# Patient Record
Sex: Male | Born: 1955 | Race: White | Hispanic: No | Marital: Married | Smoking: Never smoker
Health system: Southern US, Community
[De-identification: ages and names within clinical notes are randomized; demographics above are authoritative.]

## PROBLEM LIST (undated history)

## (undated) DIAGNOSIS — I2699 Other pulmonary embolism without acute cor pulmonale: Secondary | ICD-10-CM

## (undated) DIAGNOSIS — A770 Spotted fever due to Rickettsia rickettsii: Secondary | ICD-10-CM

## (undated) DIAGNOSIS — R569 Unspecified convulsions: Secondary | ICD-10-CM

## (undated) HISTORY — PX: OTHER SURGICAL HISTORY: SHX169

---

## 2006-10-20 ENCOUNTER — Observation Stay: Payer: Self-pay | Admitting: Internal Medicine

## 2006-10-20 ENCOUNTER — Other Ambulatory Visit: Payer: Self-pay

## 2006-10-21 ENCOUNTER — Other Ambulatory Visit: Payer: Self-pay

## 2006-10-23 ENCOUNTER — Ambulatory Visit: Payer: Self-pay | Admitting: Internal Medicine

## 2006-10-30 ENCOUNTER — Ambulatory Visit: Payer: Self-pay | Admitting: *Deleted

## 2012-06-26 ENCOUNTER — Ambulatory Visit: Payer: Self-pay | Admitting: Internal Medicine

## 2012-12-09 ENCOUNTER — Encounter (HOSPITAL_COMMUNITY): Payer: Self-pay | Admitting: *Deleted

## 2012-12-09 ENCOUNTER — Emergency Department (HOSPITAL_COMMUNITY): Payer: 59

## 2012-12-09 ENCOUNTER — Observation Stay (HOSPITAL_COMMUNITY)
Admission: EM | Admit: 2012-12-09 | Discharge: 2012-12-10 | Disposition: A | Discharge status: Home / Self Care | Payer: 59 | Attending: Internal Medicine | Admitting: Internal Medicine

## 2012-12-09 DIAGNOSIS — Z86718 Personal history of other venous thrombosis and embolism: Secondary | ICD-10-CM | POA: Insufficient documentation

## 2012-12-09 DIAGNOSIS — M79609 Pain in unspecified limb: Secondary | ICD-10-CM

## 2012-12-09 DIAGNOSIS — R0602 Shortness of breath: Secondary | ICD-10-CM | POA: Insufficient documentation

## 2012-12-09 DIAGNOSIS — G40909 Epilepsy, unspecified, not intractable, without status epilepticus: Secondary | ICD-10-CM | POA: Diagnosis present

## 2012-12-09 DIAGNOSIS — I2699 Other pulmonary embolism without acute cor pulmonale: Principal | ICD-10-CM | POA: Insufficient documentation

## 2012-12-09 DIAGNOSIS — Z86711 Personal history of pulmonary embolism: Secondary | ICD-10-CM | POA: Diagnosis present

## 2012-12-09 DIAGNOSIS — R079 Chest pain, unspecified: Secondary | ICD-10-CM | POA: Insufficient documentation

## 2012-12-09 DIAGNOSIS — R1011 Right upper quadrant pain: Secondary | ICD-10-CM | POA: Insufficient documentation

## 2012-12-09 HISTORY — DX: Unspecified convulsions: R56.9

## 2012-12-09 LAB — BASIC METABOLIC PANEL
BUN: 16 mg/dL (ref 6–23)
CO2: 21 mEq/L (ref 19–32)
Calcium: 9.9 mg/dL (ref 8.4–10.5)
Chloride: 97 mEq/L (ref 96–112)
Creatinine, Ser: 1.06 mg/dL (ref 0.50–1.35)
GFR calc Af Amer: >90 mL/min (ref >90)
GFR calc non Af Amer: 78 mL/min — ABNORMAL LOW (ref >90)
Glucose, Bld: 104 mg/dL — ABNORMAL HIGH (ref 70–99)
Potassium: 4.1 mEq/L (ref 3.5–5.1)
Sodium: 134 mEq/L — ABNORMAL LOW (ref 135–145)

## 2012-12-09 LAB — CBC
HCT: 37.7 % — ABNORMAL LOW (ref 39.0–52.0)
Hemoglobin: 13.8 g/dL (ref 13.0–17.0)
MCH: 33.3 pg (ref 26.0–34.0)
MCHC: 36.6 g/dL — ABNORMAL HIGH (ref 30.0–36.0)
MCV: 91.1 fL (ref 78.0–100.0)
Platelets: 216 10*3/uL (ref 150–400)
RBC: 4.14 MIL/uL — ABNORMAL LOW (ref 4.22–5.81)
RDW: 12.1 % (ref 11.5–15.5)
WBC: 10.7 10*3/uL — ABNORMAL HIGH (ref 4.0–10.5)

## 2012-12-09 LAB — HEPATIC FUNCTION PANEL
ALT: 15 U/L (ref 0–53)
AST: 21 U/L (ref 0–37)
Albumin: 3.6 g/dL (ref 3.5–5.2)
Alkaline Phosphatase: 56 U/L (ref 39–117)
Bilirubin, Direct: <0.1 mg/dL (ref 0.0–0.3)
Total Bilirubin: 0.4 mg/dL (ref 0.3–1.2)
Total Protein: 8.2 g/dL (ref 6.0–8.3)

## 2012-12-09 LAB — TROPONIN I: Troponin I: <0.3 ng/mL (ref <0.30)

## 2012-12-09 LAB — PROTIME-INR
INR: 1.15 (ref 0.00–1.49)
Prothrombin Time: 14.5 seconds (ref 11.6–15.2)

## 2012-12-09 LAB — D-DIMER, QUANTITATIVE: D-Dimer, Quant: 3.36 ug/mL-FEU — ABNORMAL HIGH (ref 0.00–0.48)

## 2012-12-09 LAB — POCT I-STAT TROPONIN I: Troponin i, poc: 0.08 ng/mL (ref 0.00–0.08)

## 2012-12-09 LAB — LIPASE, BLOOD: Lipase: 21 U/L (ref 11–59)

## 2012-12-09 LAB — APTT: aPTT: 33 seconds (ref 24–37)

## 2012-12-09 MED ORDER — IOHEXOL 350 MG/ML SOLN
100.0000 mL | Freq: Once | INTRAVENOUS | Status: AC | PRN
  Administered 2012-12-09: 100 mL via INTRAVENOUS

## 2012-12-09 MED ORDER — HYDROMORPHONE HCL PF 1 MG/ML IJ SOLN
1.0000 mg | Freq: Once | INTRAMUSCULAR | Status: AC
  Administered 2012-12-09: 1 mg via INTRAVENOUS
  Filled 2012-12-09: qty 1

## 2012-12-09 MED ORDER — RIVAROXABAN 15 MG PO TABS
15.0000 mg | ORAL_TABLET | Freq: Two times a day (BID) | ORAL | Status: DC
  Administered 2012-12-10: 15 mg via ORAL
  Filled 2012-12-09 (×3): qty 1

## 2012-12-09 MED ORDER — RIVAROXABAN 20 MG PO TABS: 20.0000 mg | ORAL_TABLET | Freq: Every day | ORAL | Status: DC

## 2012-12-09 MED ORDER — SODIUM CHLORIDE 0.9 % IV SOLN
INTRAVENOUS | Status: AC
  Administered 2012-12-09 – 2012-12-10 (×2): via INTRAVENOUS

## 2012-12-09 MED ORDER — HYDROCODONE-ACETAMINOPHEN 5-325 MG PO TABS: 1.0000 | ORAL_TABLET | ORAL | Status: DC | PRN

## 2012-12-09 MED ORDER — PHENYTOIN SODIUM EXTENDED 100 MG PO CAPS: 100.0000 mg | ORAL_CAPSULE | Freq: Three times a day (TID) | ORAL | Status: DC

## 2012-12-09 MED ORDER — ENOXAPARIN SODIUM 150 MG/ML ~~LOC~~ SOLN
1.0000 mg/kg | Freq: Once | SUBCUTANEOUS | Status: DC
Start: 2012-12-09 — End: 2012-12-09

## 2012-12-09 MED ORDER — LEVETIRACETAM ER 500 MG PO TB24
500.0000 mg | ORAL_TABLET | Freq: Every day | ORAL | Status: DC
  Filled 2012-12-09: qty 1

## 2012-12-09 MED ORDER — SODIUM CHLORIDE 0.9 % IJ SOLN
3.0000 mL | Freq: Two times a day (BID) | INTRAMUSCULAR | Status: DC
  Administered 2012-12-09: 3 mL via INTRAVENOUS

## 2012-12-09 MED ORDER — ENOXAPARIN SODIUM 100 MG/ML ~~LOC~~ SOLN
90.0000 mg | Freq: Two times a day (BID) | SUBCUTANEOUS | Status: DC
  Administered 2012-12-09: 90 mg via SUBCUTANEOUS
  Filled 2012-12-09 (×2): qty 1

## 2012-12-09 NOTE — ED Notes (Signed)
Pt reports he pulled a tick off ABD 2 weeks ago and he also found a tick on his Lt posterior leg.

## 2012-12-09 NOTE — ED Notes (Signed)
Pt reports pain below sternum  Occurs at night and when he takes a deep breath . Pt repots a Hx of DVT . Pt had a lump in his rt LE several days ago that went away.

## 2012-12-09 NOTE — ED Notes (Signed)
Pt reports he lost 8 lbs since Friday.

## 2012-12-09 NOTE — Progress Notes (Signed)
ANTICOAGULATION CONSULT NOTE - Initial Consult  Pharmacy Consult for Xarelto Indication: pulmonary embolus  Allergies  Allergen Reactions  . Penicillins     unknown    Patient Measurements: Height: 6\' 2"  (188 cm) Weight: 192 lb (87.091 kg) IBW/kg (Calculated) : 82.2  Vital Signs: Temp: 98.2 F (36.8 C) (05/19 2123) Temp src: Oral (05/19 2123) BP: 141/78 mmHg (05/19 2123) Pulse Rate: 74 (05/19 2123)  Labs:  Recent Labs  12/09/12 1440 12/09/12 1900  HGB 13.8  --   HCT 37.7*  --   PLT 216  --   APTT  --  33  LABPROT  --  14.5  INR  --  1.15  CREATININE 1.06  --     Estimated Creatinine Clearance: 92.6 ml/min (by C-G formula based on Cr of 1.06).   Medical History: Past Medical History  Diagnosis Date  . Seizures     Medications:  Scheduled:  . enoxaparin (LOVENOX) injection  90 mg Subcutaneous BID  . phenytoin  100 mg Oral TID  . sodium chloride  3 mL Intravenous Q12H    Assessment: 57 year old male admitted with pulmonary embolus.  He received Lovenox in the ED, and is now to transition to Xarelto.  His Xarelto should start 10-12 hours after his last dose of Lovenox.  However, phenytoin can significantly reduce exposure to Xarelto and this combination should be avoided due to risk of subtherapeutic anticoagulation.  Plan:  Discussed with Dr. Eben Burow - he will change Mr. Crunkleton seizure regimen from phenytoin to Keppra. Start Xarelto 15mg  BID x 21 days on 5/20 AM Followed by Xarelto 20mg  daily  Estella Husk, Pharm.D., BCPS Clinical Pharmacist Phone: [phone] or [phone] Pager: [phone] 12/09/2012, 10:05 PM

## 2012-12-09 NOTE — Progress Notes (Signed)
Left lower extremity venous duplex completed.  Left:  No evidence of DVT, superficial thrombosis, or Baker's cyst.  Right:  Negative for DVT in the common femoral vein.  

## 2012-12-09 NOTE — ED Provider Notes (Signed)
History     CSN: [id number]  Arrival date & time 12/09/12  1311   First MD Initiated Contact with Patient 12/09/12 1348      Chief Complaint  Patient presents with  . Rib pain   . Abdominal Pain    (Consider location/radiation/quality/duration/timing/severity/associated sxs/prior treatment) HPI Comments: Scott Fleming is a 57 y/o M with PMHx of seizures (last seizure was 26 years ago - as per patient) presenting to the ED with right rib pain and abdominal pain that has been ongoing since Friday. Patient reported that when at rest the rib pain is not that bad, but when he lays down the right rib pain is worse - described as a stabbing sensation that is intermittent and radiates across the entire abdomen and back - in a circumferential pattern. Patient reported that this pain lasts a couple of minutes, but intensifies when he lays down. Patient reported that when he lays down he has referred pain to his right shoulder. Patient stated that when he takes a deep breathe in there is stabbing pain to the right lower rib, RUQ, and across the upper abdomen and mid-back. Patient stated that the pain has gotten progressively worse over the past couple of days, leading to difficulty sleeping at night. Patient stated that he has been having episodes of shortness of breathe with activity. Patient stated that he is an active runner - runs approximately 4 times per week - stated that he tried running the other day and became short of breathe within 2 minutes. Stated that he has been feeling rather tired and fatigued most of the day. Patient reported history of DVT and varicose veins - stated that he travels frequently for his job; plane travels 4-5 times per year and continuous driving in [place] all over everyday. Patient reported that he has been having mild left calf pain that has been ongoing for the past couple of days. Reported nausea that started yesterday - stated that he had one episode of emesis  yesterday after dinner - denied nausea or vomiting today. Denied abdominal pain, headaches, fever, chills, neurological deficits.  Patient reported being bit by a tic approximately 1 week ago - stated that it was found on his abdomen shortly after working in the yard, possibly present for at least one or two days. Denied weakness, pain.   The history is provided by the patient. No language interpreter was used.    Past Medical History  Diagnosis Date  . Seizures     Past Surgical History  Procedure Laterality Date  . Skin cancer removed      No family history on file.  History  Substance Use Topics  . Smoking status: Never Smoker   . Smokeless tobacco: Not on file  . Alcohol Use: Yes     Comment: occ      Review of Systems  Constitutional: Positive for fatigue. Negative for fever and chills.  HENT: Negative for ear pain, congestion, sore throat, rhinorrhea, trouble swallowing, neck pain, neck stiffness and tinnitus.   Eyes: Negative for photophobia, pain and visual disturbance.  Respiratory: Negative for cough, chest tightness and shortness of breath.   Cardiovascular: Negative for chest pain.  Gastrointestinal: Positive for nausea, vomiting and abdominal pain. Negative for diarrhea and blood in stool.  Genitourinary: Negative for dysuria, urgency, hematuria, flank pain, decreased urine volume, discharge, penile swelling, difficulty urinating, penile pain and testicular pain.  Musculoskeletal: Negative for back pain and arthralgias.  Skin: Negative for rash and  wound.  Neurological: Negative for dizziness, seizures, speech difficulty, weakness, light-headedness, numbness and headaches.  All other systems reviewed and are negative.    Allergies  Penicillins  Home Medications   Current Outpatient Rx  Name  Route  Sig  Dispense  Refill  . phenytoin (DILANTIN) 100 MG ER capsule   Oral   Take 100 mg by mouth 3 (three) times daily.           BP 134/85  Pulse 75   Temp(Src) 98.2 F (36.8 C) (Oral)  Resp 18  SpO2 95%  Physical Exam  Nursing note and vitals reviewed. Constitutional: He is oriented to person, place, and time. He appears well-developed and well-nourished. No distress.  HENT:  Head: Normocephalic and atraumatic.  Mouth/Throat: Oropharynx is clear and moist. No oropharyngeal exudate.  Uvula midline, symmetrical elevation  Eyes: Conjunctivae and EOM are normal. Pupils are equal, round, and reactive to light. Right eye exhibits no discharge. Left eye exhibits no discharge.  Neck: Normal range of motion. Neck supple. No tracheal deviation present. No thyromegaly present.  Negative neck stiffness Negative nuchal rigidity Negative lymphadenopathy  Cardiovascular: Normal rate, regular rhythm and normal heart sounds.  Exam reveals no friction rub.   No murmur heard. Pulses:      Radial pulses are 2+ on the right side, and 2+ on the left side.       Dorsalis pedis pulses are 2+ on the right side, and 2+ on the left side.  Positive Homan's sign to the left calf  Pulmonary/Chest: Effort normal and breath sounds normal. No respiratory distress. He has no wheezes. He has no rales. He exhibits no tenderness.  Abdominal: Soft. Bowel sounds are normal. He exhibits no distension and no mass. There is no hepatosplenomegaly. There is tenderness in the right upper quadrant. There is positive Murphy's sign. There is no rigidity, no rebound, no guarding and no tenderness at McBurney's point. No hernia.    Negative Obtruator Negative Psoas  Musculoskeletal: Normal range of motion. He exhibits no edema and no tenderness.  Lymphadenopathy:    He has no cervical adenopathy.  Neurological: He is alert and oriented to person, place, and time. No cranial nerve deficit. He exhibits normal muscle tone. Coordination normal.  Cranial nerves III-XII grossly intact  Skin: Skin is warm and dry. No rash noted. He is not diaphoretic. No erythema.  Negative erythema  migrans Small red, macules noted on abdomen - approximately 4 - negative blanching. Negative pain upon palpation.  Psychiatric: He has a normal mood and affect. His behavior is normal. Thought content normal.    ED Course  Procedures (including critical care time)   Date: 12/09/2012  Rate: 111  Rhythm: sinus tachycardia and premature atrial contractions (PAC)  QRS Axis: normal  Intervals: normal  ST/T Wave abnormalities: nonspecific ST/T changes  Conduction Disutrbances:none RSR' noted in lead V1  Narrative Interpretation:   Old EKG Reviewed: none available    Labs Reviewed  CBC - Abnormal; Notable for the following:    WBC 10.7 (*)    RBC 4.14 (*)    HCT 37.7 (*)    MCHC 36.6 (*)    All other components within normal limits  BASIC METABOLIC PANEL - Abnormal; Notable for the following:    Sodium 134 (*)    Glucose, Bld 104 (*)    GFR calc non Af Amer 78 (*)    All other components within normal limits  D-DIMER, QUANTITATIVE - Abnormal; Notable for the  following:    D-Dimer, Quant 3.36 (*)    All other components within normal limits  LIPASE, BLOOD  HEPATIC FUNCTION PANEL  PROTIME-INR  APTT  POCT I-STAT TROPONIN I   Dg Chest 2 View  12/09/2012   *RADIOLOGY REPORT*  Clinical Data: Right side rib pain, chest pain for 3 days, shortness of breath  CHEST - 2 VIEW  Comparison: None  Findings: Normal heart size, mediastinal contours, and pulmonary vascularity. Minimal right pleural effusion and basilar atelectasis. Lungs otherwise clear. No segmental consolidation or pneumothorax. Scattered endplate spur formation thoracic spine.  IMPRESSION: Minimal right basilar effusion and atelectasis.   Original Report Authenticated By: Ulyses Southward, M.D.   Ct Angio Chest Pe W/cm &/or Wo Cm  12/09/2012   *RADIOLOGY REPORT*  Clinical Data: Pain under right ribs started 3 days ago, increases when the patient takes a deep breath  CT ANGIOGRAPHY CHEST  Technique:  Multidetector CT imaging of the  chest using the standard protocol during bolus administration of intravenous contrast. Multiplanar reconstructed images including MIPs were obtained and reviewed to evaluate the vascular anatomy.  Contrast: OMNIPAQUE IOHEXOL 350 MG/ML SOLN  Comparison: None  Findings: Technical failure of scanner at mid point of CTA imaging; scanner reset and repeat imaging of the entire chest was obtained at a slightly delayed interval from optimal contrast opacification. Due to diagnostic findings identifiable on the obtained images, repeat contrast injection and repeat imaging was not required. Excellent arterial phase pulmonary arterial enhancement is seen through the main pulmonary artery bifurcation.  Filling defect identified at bifurcation of the right upper lobe pulmonary artery consistent with a pulmonary embolus. Additionally, the delayed image set shows a filling defect within the descending interlobar right pulmonary artery extending into the right lower lobe consistent with an additional pulmonary embolus. No definite left side pulmonary emboli identified. Right pleural effusion with atelectasis in right lower lobe. Minimal dependent atelectasis posteriorly in left lower lobe. Upper lungs clear bilaterally. Scattered endplate spur formation thoracic spine.  IMPRESSION: Positive exam for presence of pulmonary emboli within right upper and right lower lobe pulmonary arteries as above.  Critical Value/emergent results were called by telephone at the time of interpretation on 12/09/2012 at 1900 hours to Dr. Denton Lank, who verbally acknowledged these results.   Original Report Authenticated By: Ulyses Southward, M.D.     1. Pulmonary embolism       MDM  Patient afebrile, normotensive, non-tachycardic, non-tachypenic, adequate saturation on room air, alert and oriented Not necessarily rib pain - mainly localized RUQ tenderness with positive Murphy's sign upon physical exam. Negative Psaos, obtruator sign. Negative  acute abdomen or peritoneal signs noted. Lungs clear to auscultation.  DDx: Galbladder infection STEMI/NSTEMI PE  D-dimer elevated (3.36) Hepatic function - negative findings CBC mild elevation of WBC (10.7) BMP mildly hyponatremic (134) Lipase negative findings Negative STEMI/NSTEMI, negative troponins Protime within normal limits (14.5) APTT within normal limits (33)  Chest xray minimal right basiliar effusion and atelectasis Negative DVT in left leg, negative findings for Baker's cyst. Negative right DVT.   Due to elevation of D-dimer and ateletasis noted on chest xray - CT chest angio ordered to r/o PE. CT angio - positive for pulmonary embolism within right upper and right lower lobe pulmonary arteries.  Dr. Denton Lank received phone call from CT angio - positive for PE. Patient to be admitted to the hospital. Discussed findings with patient and that patient is to be admitted. Dr. Denton Lank spoke with Internal Medicine. Will begin patient  on Lovenox in ED. Internal Medicine to see patient and to be admitted to the hospital for pulmonary embolism.           Raymon Mutton, PA-C 12/09/12 2138

## 2012-12-09 NOTE — ED Notes (Signed)
Pt is here with right under rib area pain that started Friday nite and patient states worse at night and with a deep breath.  Pt reports that pain radiates across top of abdomen.  Pt reports sob going up steps and unable to run in the last week.

## 2012-12-09 NOTE — H&P (Addendum)
History and Physical  Scott Fleming ZOX:[id number] DOB: 1955-12-26 DOA: 12/09/2012  Referring physician: Dr. Denton Lank (ER) PCP: No primary provider on file.   Chief Complaint: Chest pain  HPI: Patient is a 57 year old man with past medical history most significant for seizure disorder, past DVT 12 years ago 2 years ago and no other medical problems who comes in with chief complaints of chest pain which started on Friday that is 3 days prior to admission. Patient is fairly active and runs about 2 miles every day. Patient has been feeling decreased energy all week but on Friday developed shortness of breath on minimal exertion associated with chest pain. Chest pain was described as sharp, present on bilateral subcostal region right more than left, progressively got worse, associated with shortness of breath, worse with deep breathing cough and hiccups. No relieving factors. Pain was described as 8/10 on admission today. Patient denies any inactivity, recent travel, recent trauma, change in medications or change in diet.   CT angiogram in the ER was suggestive of bilateral pulmonary embolism. Patient is currently being admitted for observation.   Review of Systems:  15 point review of system is negative except what is noted above.  Past Medical History  Diagnosis Date  . Seizures    DVT in 2002 for which patient was on anticoagulation for at least 3 weeks  Past Surgical History  Procedure Laterality Date  . Skin cancer removed      Social History:  reports that he has never smoked. He does not have any smokeless tobacco history on file. He reports that  drinks alcohol. He reports that he does not use illicit drugs.  Allergies  Allergen Reactions  . Penicillins     unknown    Patient does not have family history of blood clots.   Prior to Admission medications   Medication Sig Start Date End Date Taking? Authorizing Provider  phenytoin (DILANTIN) 100 MG ER capsule Take 100 mg by mouth 3  (three) times daily.   Yes Historical Provider, MD   Physical Exam: Filed Vitals:   12/09/12 1405 12/09/12 1745 12/09/12 1800 12/09/12 1938  BP: 123/98 132/80 134/85   Pulse: 83 78 75   Temp:      TempSrc:      Resp: 16 19 18    Height:    6\' 2"  (1.88 m)  Weight:    192 lb (87.091 kg)  SpO2: 99% 93% 95%    Physical Exam: General: Vital signs reviewed and noted. Well-developed, well-nourished, in no acute distress; alert, appropriate and cooperative throughout examination.  Head: Normocephalic, atraumatic.  Eyes: PERRL, EOMI, No signs of anemia or jaundince.  Nose: Mucous membranes moist, not inflammed, nonerythematous.  Throat: Oropharynx nonerythematous, no exudate appreciated.   Neck: No deformities, masses, or tenderness noted.Supple, No carotid Bruits, no JVD.  Lungs:  Normal respiratory effort. Clear to auscultation BL without crackles or wheezes.  Heart: RRR. S1 and S2 normal without gallop, murmur, or rubs.  Abdomen:  BS normoactive. Soft, Nondistended, non-tender.  No masses or organomegaly.  Extremities: No pretibial edema.  Neurologic: A&O X3, CN II - XII are grossly intact. Motor strength is 5/5 in the all 4 extremities, Sensations intact to light touch, Cerebellar signs negative.  Skin: No visible rashes, scars.     Wt Readings from Last 3 Encounters:  12/09/12 192 lb (87.091 kg)    Labs on Admission:  Basic Metabolic Panel:  Recent Labs Lab 12/09/12 1440  NA 134*  K 4.1  CL 97  CO2 21  GLUCOSE 104*  BUN 16  CREATININE 1.06  CALCIUM 9.9    Liver Function Tests:  Recent Labs Lab 12/09/12 1458  AST 21  ALT 15  ALKPHOS 56  BILITOT 0.4  PROT 8.2  ALBUMIN 3.6    Recent Labs Lab 12/09/12 1440  LIPASE 21    CBC:  Recent Labs Lab 12/09/12 1440  WBC 10.7*  HGB 13.8  HCT 37.7*  MCV 91.1  PLT 216    Troponin (Point of Care Test)  Recent Labs  12/09/12 1426  TROPIPOC 0.08    Radiological Exams on Admission: Dg Chest 2  View  12/09/2012   *RADIOLOGY REPORT*  Clinical Data: Right side rib pain, chest pain for 3 days, shortness of breath  CHEST - 2 VIEW  Comparison: None  Findings: Normal heart size, mediastinal contours, and pulmonary vascularity. Minimal right pleural effusion and basilar atelectasis. Lungs otherwise clear. No segmental consolidation or pneumothorax. Scattered endplate spur formation thoracic spine.  IMPRESSION: Minimal right basilar effusion and atelectasis.   Original Report Authenticated By: Ulyses Southward, M.D.   Ct Angio Chest Pe W/cm &/or Wo Cm  12/09/2012   *RADIOLOGY REPORT*  Clinical Data: Pain under right ribs started 3 days ago, increases when the patient takes a deep breath  CT ANGIOGRAPHY CHEST  Technique:  Multidetector CT imaging of the chest using the standard protocol during bolus administration of intravenous contrast. Multiplanar reconstructed images including MIPs were obtained and reviewed to evaluate the vascular anatomy.  Contrast: OMNIPAQUE IOHEXOL 350 MG/ML SOLN  Comparison: None  Findings: Technical failure of scanner at mid point of CTA imaging; scanner reset and repeat imaging of the entire chest was obtained at a slightly delayed interval from optimal contrast opacification. Due to diagnostic findings identifiable on the obtained images, repeat contrast injection and repeat imaging was not required. Excellent arterial phase pulmonary arterial enhancement is seen through the main pulmonary artery bifurcation.  Filling defect identified at bifurcation of the right upper lobe pulmonary artery consistent with a pulmonary embolus. Additionally, the delayed image set shows a filling defect within the descending interlobar right pulmonary artery extending into the right lower lobe consistent with an additional pulmonary embolus. No definite left side pulmonary emboli identified. Right pleural effusion with atelectasis in right lower lobe. Minimal dependent atelectasis posteriorly in left  lower lobe. Upper lungs clear bilaterally. Scattered endplate spur formation thoracic spine.  IMPRESSION: Positive exam for presence of pulmonary emboli within right upper and right lower lobe pulmonary arteries as above.  Critical Value/emergent results were called by telephone at the time of interpretation on 12/09/2012 at 1900 hours to Dr. Denton Lank, who verbally acknowledged these results.   Original Report Authenticated By: Ulyses Southward, M.D.    EKG: Independently reviewed. 111 beats per minute, tachycardic, sinus rhythm, normal axis, nonspecific ST and T wave changes seen in inferior leads. 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   #1 Pulmonary embolism: Given that this is second unprovoked veno thrombotic event, patient will be on anticoagulation all his life. After long discussion with the patient and his wife, he wants to be on rivaroxaban long term. I will start him  on it tonight.  -Patient will be admitted for observation -Start anticoagulation -Check cardiac enzymes times -Check 2-D echocardiogram for right heart strain -Admit in telemetry -Repeat 12-lead EKG in the -IVF 125 cc/hr for 12 hours -start rivaroxaban per pharmacy -Patient should able to discharge home tomorrow after rule out ACS  #2 Seizure disorder: I will continue him on home dose Dilantin.  Patient's primary care physician is Dr. Lucillie Garfinkel in [place].  Addendum: Got a call from Pharmacy Marcelino Duster) informing me that Dilantin decreases the metabolic clearing of rivaroxaban by 50% and both should not be used together. I will keep him on Rivaroxaban as per their wishes and will witch dilantin to Keppra at this time.  Code  Status: Full code Family Communication: Updated at bedside Disposition Plan/Anticipated LOS: Guarded  Time spent: 70 minutes  Lars Mage, MD  [facility] Team 10  If 7PM-7AM, please contact night-coverage at [email or web address], password [facility] 12/09/2012, 8:11 PM

## 2012-12-09 NOTE — Progress Notes (Signed)
ANTICOAGULATION CONSULT NOTE - Initial Consult  Pharmacy Consult for Lovenox Indication: pulmonary embolus  Allergies  Allergen Reactions  . Penicillins     unknown    Patient Measurements:   Ht: 6 ft Wt: 192 lbs (87.2kg)  Vital Signs: Temp: 98.2 F (36.8 C) (05/19 1321) Temp src: Oral (05/19 1321) BP: 134/85 mmHg (05/19 1800) Pulse Rate: 75 (05/19 1800)  Labs:  Recent Labs  12/09/12 1440  HGB 13.8  HCT 37.7*  PLT 216  CREATININE 1.06    CrCl is unknown because there is no height on file for the current visit.   Medical History: Past Medical History  Diagnosis Date  . Seizures     Medications:  Dilantin PTA  Assessment: 57 y/o M with CT Angio + for PE within RUL and RLL arteries. H/H 13.8/37.7, renal function is good with Scr 1.06, pt states no problems with bleeding. Pt does have a remote h/o DVT around 12-14 YEARS ago.   Goal of Therapy:  Monitor platelets by anticoagulation protocol: Yes   Plan:  -Start lovenox 90 mg [place] q12h -Minimum q72h CBC -Monitor for bleeding -F/U with primary team regarding initiation of oral anti-coagulation  Abran Duke, PharmD Clinical Pharmacist Phone: [phone] Pager: [phone] 12/09/2012 7:38 PM

## 2012-12-10 DIAGNOSIS — R072 Precordial pain: Secondary | ICD-10-CM

## 2012-12-10 DIAGNOSIS — G40909 Epilepsy, unspecified, not intractable, without status epilepticus: Secondary | ICD-10-CM

## 2012-12-10 LAB — COMPREHENSIVE METABOLIC PANEL
ALT: 25 U/L (ref 0–53)
AST: 34 U/L (ref 0–37)
Albumin: 3.4 g/dL — ABNORMAL LOW (ref 3.5–5.2)
Alkaline Phosphatase: 49 U/L (ref 39–117)
BUN: 14 mg/dL (ref 6–23)
CO2: 25 mEq/L (ref 19–32)
Calcium: 9.2 mg/dL (ref 8.4–10.5)
Chloride: 99 mEq/L (ref 96–112)
Creatinine, Ser: 1 mg/dL (ref 0.50–1.35)
GFR calc Af Amer: >90 mL/min (ref >90)
GFR calc non Af Amer: 83 mL/min — ABNORMAL LOW (ref >90)
Glucose, Bld: 103 mg/dL — ABNORMAL HIGH (ref 70–99)
Potassium: 4.1 mEq/L (ref 3.5–5.1)
Sodium: 135 mEq/L (ref 135–145)
Total Bilirubin: 0.4 mg/dL (ref 0.3–1.2)
Total Protein: 7.1 g/dL (ref 6.0–8.3)

## 2012-12-10 LAB — CBC WITH DIFFERENTIAL/PLATELET
Basophils Absolute: 0 10*3/uL (ref 0.0–0.1)
Basophils Relative: 0 % (ref 0–1)
Eosinophils Absolute: 0.1 10*3/uL (ref 0.0–0.7)
Eosinophils Relative: 1 % (ref 0–5)
HCT: 35.4 % — ABNORMAL LOW (ref 39.0–52.0)
Hemoglobin: 12.7 g/dL — ABNORMAL LOW (ref 13.0–17.0)
Lymphocytes Relative: 12 % (ref 12–46)
Lymphs Abs: 1 10*3/uL (ref 0.7–4.0)
MCH: 32.7 pg (ref 26.0–34.0)
MCHC: 35.9 g/dL (ref 30.0–36.0)
MCV: 91.2 fL (ref 78.0–100.0)
Monocytes Absolute: 1.3 10*3/uL — ABNORMAL HIGH (ref 0.1–1.0)
Monocytes Relative: 15 % — ABNORMAL HIGH (ref 3–12)
Neutro Abs: 6.2 10*3/uL (ref 1.7–7.7)
Neutrophils Relative %: 73 % (ref 43–77)
Platelets: 226 10*3/uL (ref 150–400)
RBC: 3.88 MIL/uL — ABNORMAL LOW (ref 4.22–5.81)
RDW: 12.2 % (ref 11.5–15.5)
WBC: 8.6 10*3/uL (ref 4.0–10.5)

## 2012-12-10 LAB — TROPONIN I
Troponin I: <0.3 ng/mL (ref <0.30)
Troponin I: <0.3 ng/mL (ref <0.30)

## 2012-12-10 MED ORDER — LEVETIRACETAM 500 MG PO TABS
500.0000 mg | ORAL_TABLET | Freq: Two times a day (BID) | ORAL | Status: DC
  Administered 2012-12-10: 500 mg via ORAL
  Filled 2012-12-10 (×2): qty 1

## 2012-12-10 MED ORDER — RIVAROXABAN 20 MG PO TABS: 20.0000 mg | ORAL_TABLET | Freq: Every day | ORAL | Status: DC

## 2012-12-10 MED ORDER — HYDROCODONE-ACETAMINOPHEN 5-325 MG PO TABS: 1.0000 | ORAL_TABLET | Freq: Three times a day (TID) | ORAL | Status: DC | PRN

## 2012-12-10 MED ORDER — LEVETIRACETAM 500 MG PO TABS: 500.0000 mg | ORAL_TABLET | Freq: Two times a day (BID) | ORAL | Status: DC

## 2012-12-10 MED ORDER — RIVAROXABAN 15 MG PO TABS: 15.0000 mg | ORAL_TABLET | Freq: Two times a day (BID) | ORAL | Status: DC

## 2012-12-10 NOTE — ED Provider Notes (Signed)
Medical screening examination/treatment/procedure(s) were conducted as a shared visit with non-physician practitioner(s) and myself.  I personally evaluated the patient during the encounter Pt with bilateral upper quad abd/costal margin area pain. Mild decreased exercise tolerance in past week. No pleuritic pain. Remote hx dvt.  Labs. Ct angio chest.  Ct pos. Lovenox. Admit.   Suzi Roots, MD 12/10/12 [phone]

## 2012-12-10 NOTE — Care Management Note (Signed)
    Page 1 of 1   12/10/2012     3:34:36 PM   CARE MANAGEMENT NOTE 12/10/2012  Patient:  Scott Fleming, Scott Fleming   Account Number:  [id number]  Date Initiated:  12/10/2012  Documentation initiated by:  Tosh Glaze  Subjective/Objective Assessment:   PT ADM ON 12/09/12 WITH BILATERAL PULMONARY EMBOLISM.  PTA, PT INDEPENDENT, LIVES WITH SPOUSE.     Action/Plan:   CM CONSULT FOR XARELTO ASSISTANCE.  PT GIVEN FREE 10 DAY TRIAL CARD AND COPAY CARD.  HE IS APPRECIATIVE OF ASSISTANCE.   Anticipated DC Date:  12/10/2012   Anticipated DC Plan:  HOME/SELF CARE      DC Planning Services  CM consult      Choice offered to / List presented to:             Status of service:  Completed, signed off Medicare Important Message given?   (If response is "NO", the following Medicare IM given date fields will be blank) Date Medicare IM given:   Date Additional Medicare IM given:    Discharge Disposition:  HOME/SELF CARE  Per UR Regulation:  Reviewed for med. necessity/level of care/duration of stay  If discussed at Long Length of Stay Meetings, dates discussed:    Comments:

## 2012-12-10 NOTE — Progress Notes (Signed)
Nutrition Brief Note  Patient identified on the Malnutrition Screening Tool (MST) Report for recent weight loss without trying (patient unsure).  Body mass index is 24.64 kg/(m^2). Patient meets criteria for Normal based on current BMI.   Current diet order is Regular, patient is consuming approximately 100% of meals at this time. Labs and medications reviewed.   No nutrition interventions warranted at this time. If nutrition issues arise, please consult RD.   Maureen Chatters, RD, LDN Pager #: [phone] After-Hours Pager #: [phone]

## 2012-12-10 NOTE — Discharge Summary (Signed)
Physician Discharge Summary  Orvell Careaga ZOX:[id number] DOB: [birth year] DOA: 12/09/2012  PCP: No primary provider on file.  Admit date: 12/09/2012 Discharge date: 12/10/2012  Time spent: 35 minutes  Recommendations for Outpatient Follow-up:  1. Follow up with PCP to consider adjust keppra dose.  2. CBC now that patient is on Xarelto.   Discharge Diagnoses:    Pulmonary embolism   Seizure disorder   Discharge Condition:Stable  Diet recommendation: heart healthy.  Filed Weights   12/09/12 1938  Weight: 87.091 kg (192 lb)    History of present illness:  Patient is a 57 year old man with past medical history most significant for seizure disorder, past DVT 12 years ago 2 years ago and no other medical problems who comes in with chief complaints of chest pain which started on Friday that is 3 days prior to admission. Patient is fairly active and runs about 2 miles every day. Patient has been feeling decreased energy all week but on Friday developed shortness of breath on minimal exertion associated with chest pain. Chest pain was described as sharp, present on bilateral subcostal region right more than left, progressively got worse, associated with shortness of breath, worse with deep breathing cough and hiccups. No relieving factors. Pain was described as 8/10 on admission today. Patient denies any inactivity, recent travel, recent trauma, change in medications or change in diet.   Hospital Course:  1-PE: Patient was diagnosed with PE. After discussion with patient and wife it was decide to start Xarelto. Will give prescription for 15 mg BID for 3 weeks then 20 mg daily. Patient aware that there is no antidote for xarelto in the event of bleed. He accept the risk. ECHO negative.   2-Seizure; Patient was on dilantin. Dilantin decrease the effectiveness of xarelto. Patient was switch to Keppra 500 mg BID. Patient advised not to drive for now. Patient to follow up with PCP for further  adjustment of medications.   Procedures:  ECHO: Normal EF.  Consultations:  none  Discharge Exam: Filed Vitals:   12/09/12 1800 12/09/12 1938 12/09/12 2123 12/10/12 0645  BP: 134/85  141/78 135/75  Pulse: 75  74 69  Temp:   98.2 F (36.8 C) 98.5 F (36.9 C)  TempSrc:   Oral Oral  Resp: 18  18 18   Height:  6\' 2"  (1.88 m)    Weight:  87.091 kg (192 lb)    SpO2: 95%  99% 98%    General: no distress.  Cardiovascular: S 1, S 2RRR Respiratory: ronchus bases.   Discharge Instructions  Discharge Orders   Future Orders Complete By Expires     Diet - low sodium heart healthy  As directed     Increase activity slowly  As directed         Medication List    STOP taking these medications       phenytoin 100 MG ER capsule  Commonly known as:  DILANTIN      TAKE these medications       HYDROcodone-acetaminophen 5-325 MG per tablet  Commonly known as:  NORCO/VICODIN  Take 1-2 tablets by mouth every 8 (eight) hours as needed.     levETIRAcetam 500 MG tablet  Commonly known as:  KEPPRA  Take 1 tablet (500 mg total) by mouth 2 (two) times daily.     Rivaroxaban 15 MG Tabs tablet  Commonly known as:  XARELTO  Take 1 tablet (15 mg total) by mouth 2 (two) times daily with a meal.  Rivaroxaban 20 MG Tabs  Commonly known as:  XARELTO  Take 1 tablet (20 mg total) by mouth daily with supper.  Start taking on:  12/31/2012       Allergies  Allergen Reactions  . Penicillins     unknown      The results of significant diagnostics from this hospitalization (including imaging, microbiology, ancillary and laboratory) are listed below for reference.    Significant Diagnostic Studies: Dg Chest 2 View  12/09/2012   *RADIOLOGY REPORT*  Clinical Data: Right side rib pain, chest pain for 3 days, shortness of breath  CHEST - 2 VIEW  Comparison: None  Findings: Normal heart size, mediastinal contours, and pulmonary vascularity. Minimal right pleural effusion and basilar  atelectasis. Lungs otherwise clear. No segmental consolidation or pneumothorax. Scattered endplate spur formation thoracic spine.  IMPRESSION: Minimal right basilar effusion and atelectasis.   Original Report Authenticated By: Ulyses Southward, M.D.   Ct Angio Chest Pe W/cm &/or Wo Cm  12/09/2012   *RADIOLOGY REPORT*  Clinical Data: Pain under right ribs started 3 days ago, increases when the patient takes a deep breath  CT ANGIOGRAPHY CHEST  Technique:  Multidetector CT imaging of the chest using the standard protocol during bolus administration of intravenous contrast. Multiplanar reconstructed images including MIPs were obtained and reviewed to evaluate the vascular anatomy.  Contrast: OMNIPAQUE IOHEXOL 350 MG/ML SOLN  Comparison: None  Findings: Technical failure of scanner at mid point of CTA imaging; scanner reset and repeat imaging of the entire chest was obtained at a slightly delayed interval from optimal contrast opacification. Due to diagnostic findings identifiable on the obtained images, repeat contrast injection and repeat imaging was not required. Excellent arterial phase pulmonary arterial enhancement is seen through the main pulmonary artery bifurcation.  Filling defect identified at bifurcation of the right upper lobe pulmonary artery consistent with a pulmonary embolus. Additionally, the delayed image set shows a filling defect within the descending interlobar right pulmonary artery extending into the right lower lobe consistent with an additional pulmonary embolus. No definite left side pulmonary emboli identified. Right pleural effusion with atelectasis in right lower lobe. Minimal dependent atelectasis posteriorly in left lower lobe. Upper lungs clear bilaterally. Scattered endplate spur formation thoracic spine.  IMPRESSION: Positive exam for presence of pulmonary emboli within right upper and right lower lobe pulmonary arteries as above.  Critical Value/emergent results were called by  telephone at the time of interpretation on 12/09/2012 at 1900 hours to Dr. Denton Lank, who verbally acknowledged these results.   Original Report Authenticated By: Ulyses Southward, M.D.    Microbiology: No results found for this or any previous visit (from the past 240 hour(s)).   Labs: Basic Metabolic Panel:  Recent Labs Lab 12/09/12 1440 12/10/12 0345  NA 134* 135  K 4.1 4.1  CL 97 99  CO2 21 25  GLUCOSE 104* 103*  BUN 16 14  CREATININE 1.06 1.00  CALCIUM 9.9 9.2   Liver Function Tests:  Recent Labs Lab 12/09/12 1458 12/10/12 0345  AST 21 34  ALT 15 25  ALKPHOS 56 49  BILITOT 0.4 0.4  PROT 8.2 7.1  ALBUMIN 3.6 3.4*    Recent Labs Lab 12/09/12 1440  LIPASE 21   No results found for this basename: AMMONIA,  in the last 168 hours CBC:  Recent Labs Lab 12/09/12 1440 12/10/12 0345  WBC 10.7* 8.6  NEUTROABS  --  6.2  HGB 13.8 12.7*  HCT 37.7* 35.4*  MCV 91.1 91.2  PLT 216 226   Cardiac Enzymes:  Recent Labs Lab 12/09/12 2215 12/10/12 0345  TROPONINI <0.30 <0.30   BNP: BNP (last 3 results) No results found for this basename: PROBNP,  in the last 8760 hours CBG: No results found for this basename: GLUCAP,  in the last 168 hours     Signed:  REGALADO,BELKYS  [facility] 12/10/2012, 9:47 AM

## 2012-12-10 NOTE — Progress Notes (Signed)
Reviewed discharge instructions with pt and wife, questions answered, pt instructed not to resume his regular physical exercise until after followup with his PCP, next week Scott Fleming A

## 2012-12-10 NOTE — Progress Notes (Signed)
  Echocardiogram 2D Echocardiogram has been performed.  Cathie Beams 12/10/2012, 12:03 PM

## 2013-02-01 ENCOUNTER — Emergency Department (HOSPITAL_COMMUNITY): Payer: 59

## 2013-02-01 ENCOUNTER — Encounter (HOSPITAL_COMMUNITY): Payer: Self-pay

## 2013-02-01 ENCOUNTER — Inpatient Hospital Stay (HOSPITAL_COMMUNITY)
Admission: EM | Admit: 2013-02-01 | Discharge: 2013-02-03 | DRG: 815 | Disposition: A | Discharge status: Home / Self Care | Payer: 59 | Attending: Internal Medicine | Admitting: Internal Medicine

## 2013-02-01 DIAGNOSIS — R631 Polydipsia: Secondary | ICD-10-CM | POA: Diagnosis present

## 2013-02-01 DIAGNOSIS — D709 Neutropenia, unspecified: Secondary | ICD-10-CM

## 2013-02-01 DIAGNOSIS — R509 Fever, unspecified: Secondary | ICD-10-CM

## 2013-02-01 DIAGNOSIS — R7402 Elevation of levels of lactic acid dehydrogenase (LDH): Secondary | ICD-10-CM | POA: Diagnosis present

## 2013-02-01 DIAGNOSIS — L821 Other seborrheic keratosis: Secondary | ICD-10-CM | POA: Diagnosis present

## 2013-02-01 DIAGNOSIS — R7401 Elevation of levels of liver transaminase levels: Secondary | ICD-10-CM | POA: Diagnosis present

## 2013-02-01 DIAGNOSIS — B370 Candidal stomatitis: Secondary | ICD-10-CM | POA: Diagnosis present

## 2013-02-01 DIAGNOSIS — E871 Hypo-osmolality and hyponatremia: Secondary | ICD-10-CM | POA: Diagnosis present

## 2013-02-01 DIAGNOSIS — G40909 Epilepsy, unspecified, not intractable, without status epilepticus: Secondary | ICD-10-CM | POA: Diagnosis present

## 2013-02-01 DIAGNOSIS — Z86711 Personal history of pulmonary embolism: Secondary | ICD-10-CM

## 2013-02-01 DIAGNOSIS — D72819 Decreased white blood cell count, unspecified: Principal | ICD-10-CM | POA: Diagnosis present

## 2013-02-01 DIAGNOSIS — A938 Other specified arthropod-borne viral fevers: Secondary | ICD-10-CM | POA: Diagnosis present

## 2013-02-01 DIAGNOSIS — R5383 Other fatigue: Secondary | ICD-10-CM | POA: Diagnosis present

## 2013-02-01 DIAGNOSIS — D696 Thrombocytopenia, unspecified: Secondary | ICD-10-CM | POA: Diagnosis present

## 2013-02-01 HISTORY — DX: Other pulmonary embolism without acute cor pulmonale: I26.99

## 2013-02-01 LAB — CBC WITH DIFFERENTIAL/PLATELET
Basophils Absolute: 0 10*3/uL (ref 0.0–0.1)
Basophils Relative: 1 % (ref 0–1)
Eosinophils Absolute: 0 10*3/uL (ref 0.0–0.7)
Eosinophils Relative: 0 % (ref 0–5)
HCT: 37.2 % — ABNORMAL LOW (ref 39.0–52.0)
Hemoglobin: 13.2 g/dL (ref 13.0–17.0)
Lymphocytes Relative: 25 % (ref 12–46)
Lymphs Abs: 0.4 10*3/uL — ABNORMAL LOW (ref 0.7–4.0)
MCH: 31.6 pg (ref 26.0–34.0)
MCHC: 35.5 g/dL (ref 30.0–36.0)
MCV: 89 fL (ref 78.0–100.0)
Monocytes Absolute: 0.2 10*3/uL (ref 0.1–1.0)
Monocytes Relative: 13 % — ABNORMAL HIGH (ref 3–12)
Neutro Abs: 0.9 10*3/uL — ABNORMAL LOW (ref 1.7–7.7)
Neutrophils Relative %: 60 % (ref 43–77)
Platelets: 69 10*3/uL — ABNORMAL LOW (ref 150–400)
RBC: 4.18 MIL/uL — ABNORMAL LOW (ref 4.22–5.81)
RDW: 13.2 % (ref 11.5–15.5)
WBC: 1.5 10*3/uL — ABNORMAL LOW (ref 4.0–10.5)

## 2013-02-01 LAB — URINALYSIS, ROUTINE W REFLEX MICROSCOPIC
Bilirubin Urine: NEGATIVE
Glucose, UA: 100 mg/dL — AB
Ketones, ur: 15 mg/dL — AB
Leukocytes, UA: NEGATIVE
Nitrite: NEGATIVE
Protein, ur: 100 mg/dL — AB
Specific Gravity, Urine: 1.044 — ABNORMAL HIGH (ref 1.005–1.030)
Urobilinogen, UA: 0.2 mg/dL (ref 0.0–1.0)
pH: 5 (ref 5.0–8.0)

## 2013-02-01 LAB — HEPATIC FUNCTION PANEL
ALT: 103 U/L — ABNORMAL HIGH (ref 0–53)
AST: 129 U/L — ABNORMAL HIGH (ref 0–37)
Albumin: 3.9 g/dL (ref 3.5–5.2)
Alkaline Phosphatase: 41 U/L (ref 39–117)
Bilirubin, Direct: 0.1 mg/dL (ref 0.0–0.3)
Indirect Bilirubin: 0.5 mg/dL (ref 0.3–0.9)
Total Bilirubin: 0.6 mg/dL (ref 0.3–1.2)
Total Protein: 7.3 g/dL (ref 6.0–8.3)

## 2013-02-01 LAB — BASIC METABOLIC PANEL
BUN: 18 mg/dL (ref 6–23)
CO2: 21 mEq/L (ref 19–32)
Calcium: 9.4 mg/dL (ref 8.4–10.5)
Chloride: 96 mEq/L (ref 96–112)
Creatinine, Ser: 1.45 mg/dL — ABNORMAL HIGH (ref 0.50–1.35)
GFR calc Af Amer: 62 mL/min — ABNORMAL LOW (ref >90)
GFR calc non Af Amer: 53 mL/min — ABNORMAL LOW (ref >90)
Glucose, Bld: 116 mg/dL — ABNORMAL HIGH (ref 70–99)
Potassium: 4.1 mEq/L (ref 3.5–5.1)
Sodium: 130 mEq/L — ABNORMAL LOW (ref 135–145)

## 2013-02-01 LAB — HIV ANTIBODY (ROUTINE TESTING W REFLEX): HIV: NONREACTIVE

## 2013-02-01 LAB — URINE MICROSCOPIC-ADD ON

## 2013-02-01 LAB — PROTEIN, URINE, RANDOM: Total Protein, Urine: 60.4 mg/dL

## 2013-02-01 LAB — CBC
HCT: 38.2 % — ABNORMAL LOW (ref 39.0–52.0)
Hemoglobin: 14 g/dL (ref 13.0–17.0)
MCH: 32.4 pg (ref 26.0–34.0)
MCHC: 36.6 g/dL — ABNORMAL HIGH (ref 30.0–36.0)
MCV: 88.4 fL (ref 78.0–100.0)
Platelets: 68 10*3/uL — ABNORMAL LOW (ref 150–400)
RBC: 4.32 MIL/uL (ref 4.22–5.81)
RDW: 13 % (ref 11.5–15.5)
WBC: 1.8 10*3/uL — ABNORMAL LOW (ref 4.0–10.5)

## 2013-02-01 LAB — SALICYLATE LEVEL: Salicylate Lvl: <2 mg/dL — ABNORMAL LOW (ref 2.8–20.0)

## 2013-02-01 LAB — HEMOGLOBIN A1C
Hgb A1c MFr Bld: 5.2 % (ref <5.7)
Mean Plasma Glucose: 103 mg/dL (ref <117)

## 2013-02-01 LAB — PROTIME-INR
INR: 1.82 — ABNORMAL HIGH (ref 0.00–1.49)
Prothrombin Time: 20.5 seconds — ABNORMAL HIGH (ref 11.6–15.2)

## 2013-02-01 LAB — RPR: RPR Ser Ql: NONREACTIVE

## 2013-02-01 LAB — CREATININE, URINE, RANDOM: Creatinine, Urine: 199.26 mg/dL

## 2013-02-01 LAB — SODIUM, URINE, RANDOM: Sodium, Ur: 63 mEq/L

## 2013-02-01 LAB — POCT I-STAT TROPONIN I: Troponin i, poc: 0.01 ng/mL (ref 0.00–0.08)

## 2013-02-01 LAB — TSH: TSH: 0.971 u[IU]/mL (ref 0.350–4.500)

## 2013-02-01 MED ORDER — ONDANSETRON HCL 4 MG/2ML IJ SOLN
INTRAMUSCULAR | Status: AC
  Administered 2013-02-01: 4 mg
  Filled 2013-02-01: qty 2

## 2013-02-01 MED ORDER — ACETAMINOPHEN 500 MG PO TABS
500.0000 mg | ORAL_TABLET | Freq: Four times a day (QID) | ORAL | Status: DC | PRN
  Administered 2013-02-01 – 2013-02-02 (×2): 500 mg via ORAL
  Filled 2013-02-01 (×3): qty 1

## 2013-02-01 MED ORDER — SODIUM CHLORIDE 0.9 % IV SOLN
INTRAVENOUS | Status: DC
  Administered 2013-02-01: 15:00:00 via INTRAVENOUS

## 2013-02-01 MED ORDER — SODIUM CHLORIDE 0.9 % IV BOLUS (SEPSIS)
1000.0000 mL | Freq: Once | INTRAVENOUS | Status: AC
  Administered 2013-02-01: 1000 mL via INTRAVENOUS

## 2013-02-01 MED ORDER — DEXTROSE-NACL 5-0.9 % IV SOLN
INTRAVENOUS | Status: DC
  Administered 2013-02-01: 22:00:00 via INTRAVENOUS

## 2013-02-01 MED ORDER — HYDROCODONE-ACETAMINOPHEN 5-325 MG PO TABS: 1.0000 | ORAL_TABLET | ORAL | Status: DC | PRN

## 2013-02-01 MED ORDER — LEVETIRACETAM 500 MG PO TABS
500.0000 mg | ORAL_TABLET | Freq: Two times a day (BID) | ORAL | Status: DC
  Administered 2013-02-01 – 2013-02-03 (×4): 500 mg via ORAL
  Filled 2013-02-01 (×6): qty 1

## 2013-02-01 MED ORDER — NYSTATIN 100000 UNIT/ML MT SUSP
5.0000 mL | Freq: Four times a day (QID) | OROMUCOSAL | Status: DC
  Administered 2013-02-01 – 2013-02-03 (×6): 500000 [IU] via ORAL
  Filled 2013-02-01 (×10): qty 5

## 2013-02-01 MED ORDER — IOHEXOL 350 MG/ML SOLN
50.0000 mL | Freq: Once | INTRAVENOUS | Status: AC | PRN
  Administered 2013-02-01: 50 mL via INTRAVENOUS

## 2013-02-01 MED ORDER — RIVAROXABAN 20 MG PO TABS
20.0000 mg | ORAL_TABLET | Freq: Every day | ORAL | Status: DC
  Administered 2013-02-02: 20 mg via ORAL
  Filled 2013-02-01 (×2): qty 1

## 2013-02-01 MED ORDER — DOXYCYCLINE HYCLATE 100 MG PO TABS
100.0000 mg | ORAL_TABLET | Freq: Two times a day (BID) | ORAL | Status: DC
  Administered 2013-02-01 – 2013-02-03 (×4): 100 mg via ORAL
  Filled 2013-02-01 (×6): qty 1

## 2013-02-01 NOTE — ED Notes (Signed)
Phlebotomy at the bedside  

## 2013-02-01 NOTE — ED Notes (Signed)
ekg not done in triage according to protocol.

## 2013-02-01 NOTE — H&P (Signed)
Date: 02/01/2013               Patient Name:  Scott Fleming MRN: [id number]  DOB: [birth year] Age / Sex: 57 y.o., male   PCP: No primary provider on file.         Medical Service: Internal Medicine Teaching Service         Attending Physician: Dr. Rolan Bucco, MD    First Contact: Dr. Darci Needle Pager: [phone]  Second Contact: Dr. Manson Passey Pager: [phone]       After Hours (After 5p/  First Contact Pager: [phone]  weekends / holidays): Second Contact Pager: [phone]   Chief Complaint: fatigue, fever  History of Present Illness:  Mr. Scott Fleming is a 57yo caucasian man with h/o epilepsy and PE (May 2014, on xarelto) who presents with fever, fatigue, generalized weakness, decreased appetite over the past 3-4 days. Patient states that he has been healthy and increasingly active since his PE in May of this year and has taken his xarelto as prescribed until 2 weeks ago when he went on a trip to [place] and forgot to bring his xarelto, so he didn't take it for 7 days; however, he promptly restarted it when he got back home. Patient notes that for the past 3-4 days he has had fever (up to 101.9), nausea, generalized fatigue and weakness with some SOB with exertion, decreased appetite, and bilateral frontal HA. He also c/o foul smelling, dark yellow-orange colored urine over this same time period, denies increased frequency and dysuria. He has been drinking more water than usual as well, but decreased food intake and has lost 6 lbs over the past week. Patient points out that he has had a removable white film on his togue for the past few days and his wife reports he has some "growths" on his back that have increased in number recently. Denies CP, abd pain, vomiting, changes in vision, numbness/weakness to extremities, facial droop, leg swelling, leg pain. He has been taking ASA 650mg  TID for his fever for the past few days. Patient reports that he removed 2 ticks within the past month and that while he was in  [place] he received bites by "many insects." He does not recall seeing any targetoid lesion on his skin. Patient has been so tired recently he cannot walk up stairs without becoming tired and having to sit down. He has been unable to go into work over the past 3 days, which per wife, is very atypical for patient.    In the ED, VS T 99.1, BP 114/72, HR 71, R 21, SpO2 100%. Patient had CBC revealing WBC 1.8, PL 68.  CMP showed Cr 1.5 (baseline 1), AST 129, ALT 103. EKG no acute changes. Troponin negative x 1. UA showed amber colored urine with 15 ketones and 100 protein and 100 glucose. Chest CTA negative.   Meds: No current facility-administered medications on file prior to encounter.   Current Outpatient Prescriptions on File Prior to Encounter  Medication Sig Dispense Refill  . levETIRAcetam (KEPPRA) 500 MG tablet Take 1 tablet (500 mg total) by mouth 2 (two) times daily.  60 tablet  0  . Rivaroxaban (XARELTO) 20 MG TABS Take 1 tablet (20 mg total) by mouth daily with supper.  30 tablet  0  . HYDROcodone-acetaminophen (NORCO/VICODIN) 5-325 MG per tablet Take 1-2 tablets by mouth every 8 (eight) hours as needed.  30 tablet  0   Allergies: Allergies as of 02/01/2013 - Review Complete 02/01/2013  Allergen  Reaction Noted  . Penicillins  12/09/2012   Past Medical History  Diagnosis Date  . Seizures   . Pulmonary embolism    Past Surgical History  Procedure Laterality Date  . Skin cancer removed     No family history on file. History   Social History  . Marital Status: Married    Spouse Name: N/A    Number of Children: N/A  . Years of Education: N/A   Occupational History  . Not on file.   Social History Main Topics  . Smoking status: Never Smoker   . Smokeless tobacco: Not on file  . Alcohol Use: Yes     Comment: occ  . Drug Use: No  . Sexually Active: Not on file   Other Topics Concern  . Not on file   Social History Narrative  . No narrative on file   Review of  Systems: General: See HPI Skin: See HPI HEENT: no blurry vision, hearing changes, sore throat Pulm: no dyspnea, coughing, wheezing CV: See HPI Abd: See HPI GU: See HPI Ext: no arthralgias, myalgias Neuro: no weakness, numbness, or tingling  Physical Exam: Blood pressure 112/66, pulse 71, temperature 99.1 F (37.3 C), temperature source Oral, resp. rate 14, SpO2 100.00%. General: Patient is alert, cooperative, and in no apparent distress, but appears tired and with somewhat depressed affect HEENT: pupils equal round and reactive to light, vision grossly intact, dry mucous membranes with white film that scrapes off with tongue blade Neck: supple, no cervical or supraclavicular lymphadenopathy Skin: multiple light brown colored plaques with verrucous appearance and waxy texture to back; scattered blanchable erythematous papules and macules to back, chest and abdomen Lungs: clear to ascultation bilaterally, normal work of respiration, no wheezes, rales, ronchi Heart: regular rate and rhythm, no murmurs, gallops, or rubs Abdomen: soft, non-tender, non-distended, normal bowel sounds Extremities: warm extremities, no cyanosis, clubbing, or edema Neurologic: alert & oriented X3, cranial nerves II-XII intact- no facial droop, strength 5/5 throughout upper and lower extremities bilaterally, sensation intact to light touch throughout, normal finger-nose test, unable to elicit patellar and achilles tendon reflexes, down-going babinski bilaterally  Lab results: Basic Metabolic Panel:  Recent Labs  65/78/46 0958  NA 130*  K 4.1  CL 96  CO2 21  GLUCOSE 116*  BUN 18  CREATININE 1.45*  CALCIUM 9.4   Liver Function Tests:  Recent Labs  02/01/13 0958  AST 129*  ALT 103*  ALKPHOS 41  BILITOT 0.6  PROT 7.3  ALBUMIN 3.9   CBC:  Recent Labs  02/01/13 0958 02/01/13 1432  WBC 1.8* 1.5*  NEUTROABS  --  0.9*  HGB 14.0 13.2  HCT 38.2* 37.2*  MCV 88.4 89.0  PLT 68* 69*   Cardiac  Enzymes: POC troponin 0.01  Urinalysis:  Recent Labs  02/01/13 1219  COLORURINE AMBER*  LABSPEC 1.044*  PHURINE 5.0  GLUCOSEU 100*  HGBUR TRACE*  BILIRUBINUR NEGATIVE  KETONESUR 15*  PROTEINUR 100*  UROBILINOGEN 0.2  NITRITE NEGATIVE  LEUKOCYTESUR NEGATIVE    Imaging results:  Ct Angio Chest Pe W/cm &/or Wo Cm  02/01/2013   *RADIOLOGY REPORT*  Clinical Data: Weakness, chills and history of pulmonary embolism.  CT ANGIOGRAPHY CHEST  Technique:  Multidetector CT imaging of the chest using the standard protocol during bolus administration of intravenous contrast. Multiplanar reconstructed images including MIPs were obtained and reviewed to evaluate the vascular anatomy.  Contrast: 50mL OMNIPAQUE IOHEXOL 350 MG/ML SOLN  Comparison: None.  Findings: There is no evidence  of acute pulmonary embolism.  Lungs show atelectasis at the posterior right base.  No pleural effusions are identified.  There is no evidence of infiltrate or edema.  No nodules or enlarged lymph nodes are seen.  Heart size is normal.  The thoracic aorta is of normal caliber.  Degenerative changes are present in the thoracic spine.  IMPRESSION: No acute findings.  No evidence of pulmonary embolism.   Original Report Authenticated By: Irish Lack, M.D.   Other results: EKG: NSR, normal axis and intervals, no ischemic changes  Assessment & Plan by Problem: Mr. Dennington is a 57yo caucasian man with h/o epilepsy and PE (May 2014, on xarelto) who presents with fever, fatigue, generalized weakness, decreased appetite and newly found leukopenia, thrombocytopenia, mildly elevated LFTs, hyponatremia and increased creatinine suspicious for malignancy vs tick borne illness vs hepatitis vs symptomatic hyponatremia.  # Leukopenia w/ oral thrush: Patient found to have WBC count 1.8 on admission. He also has thrombocytopenia at 68. Differential at this time includes myelodysplastic syndrome, leukemia, HIV, infection. Neutropenic fever  ruled out w/ a ANC of 900. The oral thrush and fever is a sign of immunocompromise. Will need to elucidate which type of WBC is decreased as well as cell morphology. Patient describes having increased skin lesions recently that on physical exam are c/w seborrheic keratoses that can increase in number rapidly in malignancy. -CBC w/ smear; repeat CBC w/ diff in am -HIV ab test -nystatin swish and swallow  # Fever: Patient is leukopenic and is at increased risk for various opportunistic infections, but given his history of tick exposure and other insect bites, other items on the differential at this time include RMSF, Lyme disease, Ehrlichiosis.  -doxycycline 100mg  bid empirically for possible tick borne illness. -BCx -RMSF, Ehrlichiosis, and Lyme titers -HIV ab test -RPR -UA w/ 100 glucose, 100 protein, 15 ketones- no sign of infection- but will send for UCx -chest CT done in ED was negative for PNA and PE  # Hyponatremia: Unclear etiology at this time. Patient has had decreased PO intake with polydipsia and a low-normal Posm (272), which is likely diluting his Na concentration. This will be elucidated by Uosm (<100 is c/w polydipsia).  - FeNa 0.35%, which indicates prerenal etiology  -Uosm, Uprotein pending  # Transaminitis with increased INR: Though the transaminitis is mild, the INR is elevated to 1.82. Etiology is unclear at this point, but may be 2/2 tick borne illness. If this does not resolve, will consider checking titers for hep B, hep A, hep C.   # Increased Anion Gap- Patient has AG 13, which is only mildly elevated, but may represent starvation ketoacidosis 2/2 decreased PO intake recently. Originally thought to be due to increased ASA use, but salicylate level <2.0. -IVF D5NS 100cc/hr- received 1L NS in ED  # Polydipsia w/ glucosuria: Patient reports fatigue, increased free water intake recently and has 100 glucose in UA, which may indicate new onset of diabetes mellitus. Blood  glucose 116 on admission. -HbA1c  # Epilepsy- continue home keppra  # VTE: SCDs and xarelto   # Diet: regular  Code status: Full  Dispo: Disposition is deferred at this time, awaiting improvement of current medical problems. Anticipated discharge in approximately 3 day(s).   The patient does not have a current PCP (No primary provider on file.) and does need an [facility] hospital follow-up appointment after discharge.  The patient does not have transportation limitations that hinder transportation to clinic appointments.  Signed: Windell Hummingbird, MD  02/01/2013, 3:38 PM

## 2013-02-01 NOTE — ED Provider Notes (Signed)
History    CSN: [id number] Arrival date & time 02/01/13  4098  First MD Initiated Contact with Patient 02/01/13 [phone]     Chief Complaint  Patient presents with  . Fatigue    took trip in May missed xarelto for 9 days.   (Consider location/radiation/quality/duration/timing/severity/associated sxs/prior Treatment) HPI Comments: Patient presents with profound fatigue. He was diagnosed with a pulmonary embolus on May 19 of this year. He was started on his relative [place]. He states he was doing well until 2 weeks ago when he went on a trip to [place]. He was off of his relative for about 7 or 8 days. He started back again about 3 or 4 days ago. Over the last 3 or 4 days she's had some profound fatigue. He states he has no energy. He was previously a runner and it actually gotten back to running after the blood clots but now he gets short of breath just walking upstairs. He had some achiness in his upper abdomen and under his left breast during the night but denies any current chest pain or abdominal pain. He denies any pleuritic-type chest pain. He denies any leg pain or swelling. He's had some chills and some subjective fevers at home. He's been taking some aspirin for this at home. He's had some lower back pain but he attributes this to lay no better for the last 3 or 4 days. He currently denies any significant pain or discomfort. He denies any rashes or known tick bites. He denies any nausea vomiting or diarrhea. He states that these are similar symptoms to what he had when he was diagnosed with a blood clot on the 19th.  Past Medical History  Diagnosis Date  . Seizures   . Pulmonary embolism    Past Surgical History  Procedure Laterality Date  . Skin cancer removed     No family history on file. History  Substance Use Topics  . Smoking status: Never Smoker   . Smokeless tobacco: Not on file  . Alcohol Use: Yes     Comment: occ    Review of Systems  Constitutional: Positive for fever,  chills, activity change, appetite change and fatigue. Negative for diaphoresis.  HENT: Negative for congestion, rhinorrhea, sneezing, neck pain and neck stiffness.   Eyes: Negative.   Respiratory: Positive for shortness of breath. Negative for cough and chest tightness.   Cardiovascular: Negative for chest pain and leg swelling.  Gastrointestinal: Positive for abdominal pain. Negative for nausea, vomiting, diarrhea and blood in stool.  Genitourinary: Negative for frequency, hematuria, flank pain and difficulty urinating.  Musculoskeletal: Negative for back pain and arthralgias.  Skin: Negative for rash and wound.  Neurological: Negative for dizziness, speech difficulty, weakness, numbness and headaches.    Allergies  Penicillins  Home Medications   Current Outpatient Rx  Name  Route  Sig  Dispense  Refill  . aspirin 325 MG tablet   Oral   Take 325-650 mg by mouth every 6 (six) hours as needed for pain.         Marland Kitchen levETIRAcetam (KEPPRA) 500 MG tablet   Oral   Take 1 tablet (500 mg total) by mouth 2 (two) times daily.   60 tablet   0   . Rivaroxaban (XARELTO) 20 MG TABS   Oral   Take 1 tablet (20 mg total) by mouth daily with supper.   30 tablet   0   . HYDROcodone-acetaminophen (NORCO/VICODIN) 5-325 MG per tablet   Oral  Take 1-2 tablets by mouth every 8 (eight) hours as needed.   30 tablet   0    BP 112/66  Pulse 71  Temp(Src) 99.1 F (37.3 C) (Oral)  Resp 14  SpO2 100% Physical Exam  Constitutional: He is oriented to person, place, and time. He appears well-developed and well-nourished.  HENT:  Head: Normocephalic and atraumatic.  Oral thrush noted  Eyes: Pupils are equal, round, and reactive to light.  Neck: Normal range of motion. Neck supple.  Cardiovascular: Normal rate, regular rhythm and normal heart sounds.   Pulmonary/Chest: Effort normal and breath sounds normal. No respiratory distress. He has no wheezes. He has no rales. He exhibits no tenderness.   Abdominal: Soft. Bowel sounds are normal. There is no tenderness. There is no rebound and no guarding.  Musculoskeletal: Normal range of motion. He exhibits no edema.  Lymphadenopathy:    He has no cervical adenopathy.  Neurological: He is alert and oriented to person, place, and time.  Skin: Skin is warm and dry. No rash noted.  Psychiatric: He has a normal mood and affect.    ED Course  Procedures (including critical care time) Results for orders placed during the hospital encounter of 02/01/13  BASIC METABOLIC PANEL      Result Value Range   Sodium 130 (*) 135 - 145 mEq/L   Potassium 4.1  3.5 - 5.1 mEq/L   Chloride 96  96 - 112 mEq/L   CO2 21  19 - 32 mEq/L   Glucose, Bld 116 (*) 70 - 99 mg/dL   BUN 18  6 - 23 mg/dL   Creatinine, Ser 1.61 (*) 0.50 - 1.35 mg/dL   Calcium 9.4  8.4 - 09.6 mg/dL   GFR calc non Af Amer 53 (*) >90 mL/min   GFR calc Af Amer 62 (*) >90 mL/min  CBC      Result Value Range   WBC 1.8 (*) 4.0 - 10.5 K/uL   RBC 4.32  4.22 - 5.81 MIL/uL   Hemoglobin 14.0  13.0 - 17.0 g/dL   HCT 04.5 (*) 40.9 - 81.1 %   MCV 88.4  78.0 - 100.0 fL   MCH 32.4  26.0 - 34.0 pg   MCHC 36.6 (*) 30.0 - 36.0 g/dL   RDW 91.4  78.2 - 95.6 %   Platelets 68 (*) 150 - 400 K/uL  HEPATIC FUNCTION PANEL      Result Value Range   Total Protein 7.3  6.0 - 8.3 g/dL   Albumin 3.9  3.5 - 5.2 g/dL   AST 213 (*) 0 - 37 U/L   ALT 103 (*) 0 - 53 U/L   Alkaline Phosphatase 41  39 - 117 U/L   Total Bilirubin 0.6  0.3 - 1.2 mg/dL   Bilirubin, Direct 0.1  0.0 - 0.3 mg/dL   Indirect Bilirubin 0.5  0.3 - 0.9 mg/dL  URINALYSIS, ROUTINE W REFLEX MICROSCOPIC      Result Value Range   Color, Urine AMBER (*) YELLOW   APPearance CLOUDY (*) CLEAR   Specific Gravity, Urine 1.044 (*) 1.005 - 1.030   pH 5.0  5.0 - 8.0   Glucose, UA 100 (*) NEGATIVE mg/dL   Hgb urine dipstick TRACE (*) NEGATIVE   Bilirubin Urine NEGATIVE  NEGATIVE   Ketones, ur 15 (*) NEGATIVE mg/dL   Protein, ur 086 (*) NEGATIVE  mg/dL   Urobilinogen, UA 0.2  0.0 - 1.0 mg/dL   Nitrite NEGATIVE  NEGATIVE  Leukocytes, UA NEGATIVE  NEGATIVE  URINE MICROSCOPIC-ADD ON      Result Value Range   RBC / HPF 0-2  <3 RBC/hpf   Casts GRANULAR CAST (*) NEGATIVE   Urine-Other MUCOUS PRESENT    CBC WITH DIFFERENTIAL      Result Value Range   WBC 1.5 (*) 4.0 - 10.5 K/uL   RBC 4.18 (*) 4.22 - 5.81 MIL/uL   Hemoglobin 13.2  13.0 - 17.0 g/dL   HCT 16.1 (*) 09.6 - 04.5 %   MCV 89.0  78.0 - 100.0 fL   MCH 31.6  26.0 - 34.0 pg   MCHC 35.5  30.0 - 36.0 g/dL   RDW 40.9  81.1 - 91.4 %   Platelets 69 (*) 150 - 400 K/uL   Neutrophils Relative % 60  43 - 77 %   Neutro Abs 0.9 (*) 1.7 - 7.7 K/uL   Lymphocytes Relative 25  12 - 46 %   Lymphs Abs 0.4 (*) 0.7 - 4.0 K/uL   Monocytes Relative 13 (*) 3 - 12 %   Monocytes Absolute 0.2  0.1 - 1.0 K/uL   Eosinophils Relative 0  0 - 5 %   Eosinophils Absolute 0.0  0.0 - 0.7 K/uL   Basophils Relative 1  0 - 1 %   Basophils Absolute 0.0  0.0 - 0.1 K/uL  PROTIME-INR      Result Value Range   Prothrombin Time 20.5 (*) 11.6 - 15.2 seconds   INR 1.82 (*) 0.00 - 1.49  SALICYLATE LEVEL      Result Value Range   Salicylate Lvl <2.0 (*) 2.8 - 20.0 mg/dL  POCT I-STAT TROPONIN I      Result Value Range   Troponin i, poc 0.01  0.00 - 0.08 ng/mL   Comment 3            Ct Angio Chest Pe W/cm &/or Wo Cm  02/01/2013   *RADIOLOGY REPORT*  Clinical Data: Weakness, chills and history of pulmonary embolism.  CT ANGIOGRAPHY CHEST  Technique:  Multidetector CT imaging of the chest using the standard protocol during bolus administration of intravenous contrast. Multiplanar reconstructed images including MIPs were obtained and reviewed to evaluate the vascular anatomy.  Contrast: 50mL OMNIPAQUE IOHEXOL 350 MG/ML SOLN  Comparison: None.  Findings: There is no evidence of acute pulmonary embolism.  Lungs show atelectasis at the posterior right base.  No pleural effusions are identified.  There is no evidence of  infiltrate or edema.  No nodules or enlarged lymph nodes are seen.  Heart size is normal.  The thoracic aorta is of normal caliber.  Degenerative changes are present in the thoracic spine.  IMPRESSION: No acute findings.  No evidence of pulmonary embolism.   Original Report Authenticated By: Irish Lack, M.D.     Ct Angio Chest Pe W/cm &/or Wo Cm  02/01/2013   *RADIOLOGY REPORT*  Clinical Data: Weakness, chills and history of pulmonary embolism.  CT ANGIOGRAPHY CHEST  Technique:  Multidetector CT imaging of the chest using the standard protocol during bolus administration of intravenous contrast. Multiplanar reconstructed images including MIPs were obtained and reviewed to evaluate the vascular anatomy.  Contrast: 50mL OMNIPAQUE IOHEXOL 350 MG/ML SOLN  Comparison: None.  Findings: There is no evidence of acute pulmonary embolism.  Lungs show atelectasis at the posterior right base.  No pleural effusions are identified.  There is no evidence of infiltrate or edema.  No nodules or enlarged lymph nodes are seen.  Heart size is normal.  The thoracic aorta is of normal caliber.  Degenerative changes are present in the thoracic spine.  IMPRESSION: No acute findings.  No evidence of pulmonary embolism.   Original Report Authenticated By: Irish Lack, M.D.   1. Thrombocytopenia   2. Neutropenia     MDM  Patient is noted to have thrombocytopenia and neutropenia with profound fatigue. The differential diagnosis is between feedings could include infection including HIV, medication reaction, or myeloproliferative disorder. I counseled him with the internal medicine teaching service who is on call for unassigned and they will admit the patient for further evaluation.  Rolan Bucco, MD 02/01/13 1556

## 2013-02-01 NOTE — ED Notes (Signed)
Pt gone for CT scan

## 2013-02-01 NOTE — ED Notes (Signed)
Pt returned from CT °

## 2013-02-01 NOTE — ED Notes (Signed)
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 When he returned he resumed medication as normal. Tuesday afternoon started feeling weak feeling and having chills. Since then has been having a low grade fever. Nauseated earlier in the week.

## 2013-02-01 NOTE — ED Notes (Signed)
Patient transported to CT 

## 2013-02-01 NOTE — ED Notes (Signed)
Dr. Fredderick Phenix back at the bedside with patient and family

## 2013-02-02 LAB — COMPREHENSIVE METABOLIC PANEL
ALT: 88 U/L — ABNORMAL HIGH (ref 0–53)
AST: 95 U/L — ABNORMAL HIGH (ref 0–37)
Albumin: 3 g/dL — ABNORMAL LOW (ref 3.5–5.2)
Alkaline Phosphatase: 31 U/L — ABNORMAL LOW (ref 39–117)
BUN: 14 mg/dL (ref 6–23)
CO2: 23 mEq/L (ref 19–32)
Calcium: 8.1 mg/dL — ABNORMAL LOW (ref 8.4–10.5)
Chloride: 100 mEq/L (ref 96–112)
Creatinine, Ser: 1.33 mg/dL (ref 0.50–1.35)
GFR calc Af Amer: 69 mL/min — ABNORMAL LOW (ref >90)
GFR calc non Af Amer: 59 mL/min — ABNORMAL LOW (ref >90)
Glucose, Bld: 135 mg/dL — ABNORMAL HIGH (ref 70–99)
Potassium: 3.5 mEq/L (ref 3.5–5.1)
Sodium: 131 mEq/L — ABNORMAL LOW (ref 135–145)
Total Bilirubin: 0.4 mg/dL (ref 0.3–1.2)
Total Protein: 5.7 g/dL — ABNORMAL LOW (ref 6.0–8.3)

## 2013-02-02 LAB — OSMOLALITY, URINE: Osmolality, Ur: 699 mOsm/kg (ref 390–1090)

## 2013-02-02 LAB — URINE CULTURE
Colony Count: NO GROWTH
Culture: NO GROWTH

## 2013-02-02 MED ORDER — SODIUM CHLORIDE 0.9 % IV SOLN
INTRAVENOUS | Status: DC
  Administered 2013-02-02 (×2): via INTRAVENOUS

## 2013-02-02 NOTE — Progress Notes (Signed)
Subjective: Mr. Thrall is feeling slightly better today. He still has a bilateral frontal HA and felt restless overnight. He also reports increased sweating overnight and into this morning. However, he states that his energy is improved today. Denies CP, abd pain, numbness/tingling, focal weakness, joint pain, SOB. He received 1 dose of doxycycline last night.   Objective: Vital signs in last 24 hours: Filed Vitals:   02/01/13 2155 02/02/13 0520 02/02/13 0600 02/02/13 1000  BP: 141/90  105/62 104/66  Pulse: 79  76 68  Temp: 98.1 F (36.7 C) 99.4 F (37.4 C) 100 F (37.8 C) 98.8 F (37.1 C)  TempSrc: Oral Oral Oral Oral  Resp: 18  18 18   Height: 6' (1.829 m)     Weight: 193 lb 2 oz (87.6 kg)     SpO2: 100%  98% 98%   Intake/Output Summary (Last 24 hours) at 02/02/13 1152 Last data filed at 02/02/13 0900  Gross per 24 hour  Intake   1480 ml  Output      2 ml  Net   1478 ml   Physical exam General: Patient is alert, cooperative, and in no apparent distress, and appears more lively and in better spirits today than yesterday HEENT: pupils equal round and reactive to light, vision grossly intact, dry mucous membranes with white film Neck: supple, no cervical or supraclavicular lymphadenopathy  Skin: multiple light brown colored plaques with verrucous appearance and waxy texture to back; scattered blanchable erythematous papules and macules to back, chest and abdomen- however this erythematous rash appears slightly improved today Lungs: clear to ascultation bilaterally, normal work of respiration, no wheezes, rales, ronchi Heart: regular rate and rhythm, no murmurs, gallops, or rubs Abdomen: soft, non-tender, non-distended, normal bowel sounds  Extremities: warm extremities, no edema Neurologic: alert & oriented X3, cranial nerves II-XII intact- no facial droop, strength and sensation grossly intact throughout, patient moves all extremities spontaneously and he is able to stand up  without difficulty  Lab Results: Basic Metabolic Panel:  Recent Labs Lab 02/01/13 0958 02/02/13 0605  NA 130* 131*  K 4.1 3.5  CL 96 100  CO2 21 23  GLUCOSE 116* 135*  BUN 18 14  CREATININE 1.45* 1.33  CALCIUM 9.4 8.1*   Liver Function Tests:  Recent Labs Lab 02/01/13 0958 02/02/13 0605  AST 129* 95*  ALT 103* 88*  ALKPHOS 41 31*  BILITOT 0.6 0.4  PROT 7.3 5.7*  ALBUMIN 3.9 3.0*   CBC:  Recent Labs Lab 02/01/13 0958 02/01/13 1432  WBC 1.8* 1.5*  NEUTROABS  --  0.9*  HGB 14.0 13.2  HCT 38.2* 37.2*  MCV 88.4 89.0  PLT 68* 69*   Cardiac Enzymes:  POC troponin 0.01  Hemoglobin A1C:  Recent Labs Lab 02/01/13 1432  HGBA1C 5.2   Fasting Lipid Panel: No results found for this basename: CHOL, HDL, LDLCALC, TRIG, CHOLHDL, LDLDIRECT,  in the last 168 hours Thyroid Function Tests:  Recent Labs Lab 02/01/13 1432  TSH 0.971   Coagulation:  Recent Labs Lab 02/01/13 1432  LABPROT 20.5*  INR 1.82*   Urinalysis:  Recent Labs Lab 02/01/13 1219  COLORURINE AMBER*  LABSPEC 1.044*  PHURINE 5.0  GLUCOSEU 100*  HGBUR TRACE*  BILIRUBINUR NEGATIVE  KETONESUR 15*  PROTEINUR 100*  UROBILINOGEN 0.2  NITRITE NEGATIVE  LEUKOCYTESUR NEGATIVE   Misc. Labs: Uprotein- 60.4 Uosm- 699 Una 63 Ucr-199  HIV NR RPR NR TSH .971  Ucx pending Bcx pending  Micro Results: No results found  for this or any previous visit (from the past 240 hour(s)). Studies/Results: Ct Angio Chest Pe W/cm &/or Wo Cm  02/01/2013   *RADIOLOGY REPORT*  Clinical Data: Weakness, chills and history of pulmonary embolism.  CT ANGIOGRAPHY CHEST  Technique:  Multidetector CT imaging of the chest using the standard protocol during bolus administration of intravenous contrast. Multiplanar reconstructed images including MIPs were obtained and reviewed to evaluate the vascular anatomy.  Contrast: 50mL OMNIPAQUE IOHEXOL 350 MG/ML SOLN  Comparison: None.  Findings: There is no evidence of  acute pulmonary embolism.  Lungs show atelectasis at the posterior right base.  No pleural effusions are identified.  There is no evidence of infiltrate or edema.  No nodules or enlarged lymph nodes are seen.  Heart size is normal.  The thoracic aorta is of normal caliber.  Degenerative changes are present in the thoracic spine.  IMPRESSION: No acute findings.  No evidence of pulmonary embolism.   Original Report Authenticated By: Irish Lack, M.D.   Medications: I have reviewed the patient's current medications. Scheduled Meds: . doxycycline  100 mg Oral Q12H  . levETIRAcetam  500 mg Oral BID  . nystatin  5 mL Oral QID  . Rivaroxaban  20 mg Oral Q supper   Continuous Infusions: . sodium chloride 100 mL/hr at 02/02/13 1111   PRN Meds:.acetaminophen, HYDROcodone-acetaminophen  Assessment/Plan: Mr. Neubert is a 57yo caucasian man with h/o epilepsy and PE (May 2014, on xarelto) who presents with fever, fatigue, generalized weakness, decreased appetite and newly found leukopenia, thrombocytopenia, mildly elevated LFTs, hyponatremia and increased creatinine suspicious for malignancy vs tick borne illness vs other infection vs symptomatic hyponatremia.   # Leukopenia w/ oral thrush and fever: Patient febrile at home to 101.9 with Tmax here of 100, found to have leukopenia, WBC 1.8, and thrombocytopenia, PL 68. Given the constellation of symptoms and laboratory abnormalities (transaminitis, hyponatremia, leukopenia, thrombocytopenia) along with h/o tick and other insect exposure, I favor tick borne illness at this time. RMSF and Lyme disease are known to cause blood cell dyscrasias as well as increase in LFTs. RMSF is known to cause hyponatremia 2/2 intravascular volume depletion 2/2 vascultitis and leaky blood vessels. Intravascular volume depletion may also explain the rise in Cr as well as a prerenal FeNa. Other causes of blood cell dyscrasias with concomitant electrolyte and LFT abnormalities may be  explained by certain types of leukemia (though would expect to have a drop in all cell lines with marrow infiltration), myelodysplastic disorders, and other infections including opportunistic infections given his low WBC count. Patient describes having increased skin lesions recently that on physical exam are c/w seborrheic keratoses that can increase in number rapidly in malignancy. Will await tick borne illness titers (should be resulted tomorrow per lab) before acting on other items on the differential. -doxycycline 100mg  bid empirically for possible tick borne illness (started 7/12)  -RMSF, Ehrlichiosis, and Lyme titers  -chest CT done in ED was negative for PNA and PE  -blood smear, UCx, BCx pending -nystatin swish and swallow for oral thrush -HIV, RPR NR -TSH .971   # AKI and Hyponatremia: Likely hypovolemic hyponatremia 2/2 decreased PO intake. Posm is low-normal, but Uosm is >100, so this is not caused by polydipsia. Since patient appears to have prerenal etiology for AKI (FeNa .35%), patient likely has hyponatremia from his volume depletion. RMSF can cause hyponatremia as well, which is another potential cause.  # Transaminitis: Transaminitis is mild, and while INR is significantly elevated, this is thought  to be 2/2 his xarelto use which can alter INR. Etiology of transaminitis  is unclear at this point, but may be 2/2 tick borne illness. If this does not resolve, will consider checking titers for hep B, hep A, hep C.   # Increased Anion Gap- Resolved. Patient had AG 13 on admission that decreased to 7 this morning. Likely 2/2 starvation ketoacidosis 2/2 decreased PO intake recently. Originally thought to be due to increased ASA use, but salicylate level <2.0.  -IVF: changed from D5NS 100cc/hr to NS 100cc/hr today given AG resolved  # Polydipsia w/ glucosuria: Originally, I was suspicious for diabetes mellitus given the recent fatigue, but HbA1c 5.2%. Suspect that patient was drinking more  free water because of his mouth feeling dry from the oral thrush. Patient has prerenal AKI, so he is actually volume depleted. Patient had blood glucose of 116 on admission and repeat 135- likely due to increase in counteregulatory hormones given his recent illness.   # Epilepsy- continue home keppra   # VTE: SCDs and xarelto   # Diet: regular   Code status: Full  Dispo: Disposition is deferred at this time, awaiting improvement of current medical problems.  Anticipated discharge in approximately 2 day(s).   The patient does have a current PCP (No primary provider on file.) and does not need an [facility] hospital follow-up appointment after discharge. PCP in [place].   The patient does not have transportation limitations that hinder transportation to clinic appointments.  .Services Needed at time of discharge: Y = Yes, Blank = No PT:   OT:   RN:   Equipment:   Other:     LOS: 1 day   Windell Hummingbird, MD 02/02/2013, 11:52 AM

## 2013-02-02 NOTE — Progress Notes (Signed)
Pt with flushed appearance and complaints of headache.  Admin 500mg  tylenol p.o, removal of scd's as pt reports they were making him hot. Denies pain. Will continue to monitor

## 2013-02-02 NOTE — H&P (Signed)
I saw and evaluated the patient. I reviewed the resident's note and confirmed the resident's findings.  I agree with the assessment and plan as documented in the resident's note.  Briefly, Scott Fleming is a 57 year old man with a hip history of epilepsy and recently diagnosed pulmonary embolism in early May who presents with a three-day history of fevers to 101.9, fatigue, generalized weakness, sweats, headache, and decreased appetite. When seen in the emergency department he was found to be leukopenic with a white blood cell count of 1.8 and thrombocytopenic with platelets of 68,000. He was therefore admitted to the internal medicine teaching service for further evaluation. He has had an increase in the number of seborrheic keratoses on his back but denies any chest pain, abdominal pain, vomiting, or new neurologic symptoms. Of note, when seen here in early May he had a normal white blood cell count and platelet count. He also admits to pulling two ticks off of his body within the last month prior to visiting [place]. While in [place] he noted several bug bites on his legs after camping but denies seeing any ticks at this time. Our working diagnosis is a tick borne illness given his fevers, generalized malaise, and leukopenia/thrombocytopenia. We are therefore starting doxycycline and obtaining titers for the more common tick borne illnesses. We will watch him clinically for evidence of improvement in his overall symptoms including fevers and generalized malaise as well as improvement in his blood count and thrombocytopenia while on the doxycycline. The differential for leukopenia and thrombocytopenia includes a marrow infiltrative process but we will hold off on obtaining a bone marrow biopsy pending his clinical response to the doxycycline as we feel it tick borne illness is more likely.

## 2013-02-03 LAB — CBC WITH DIFFERENTIAL/PLATELET
Basophils Absolute: 0.2 10*3/uL — ABNORMAL HIGH (ref 0.0–0.1)
Basophils Relative: 6 % — ABNORMAL HIGH (ref 0–1)
Eosinophils Absolute: 0 10*3/uL (ref 0.0–0.7)
Eosinophils Relative: 0 % (ref 0–5)
HCT: 32.1 % — ABNORMAL LOW (ref 39.0–52.0)
Hemoglobin: 11.5 g/dL — ABNORMAL LOW (ref 13.0–17.0)
Lymphocytes Relative: 43 % (ref 12–46)
Lymphs Abs: 1 10*3/uL (ref 0.7–4.0)
MCH: 31.7 pg (ref 26.0–34.0)
MCHC: 35.8 g/dL (ref 30.0–36.0)
MCV: 88.4 fL (ref 78.0–100.0)
Monocytes Absolute: 0.7 10*3/uL (ref 0.1–1.0)
Monocytes Relative: 26 % — ABNORMAL HIGH (ref 3–12)
Neutro Abs: 0.7 10*3/uL — ABNORMAL LOW (ref 1.7–7.7)
Neutrophils Relative %: 25 % — ABNORMAL LOW (ref 43–77)
Platelets: 74 10*3/uL — ABNORMAL LOW (ref 150–400)
RBC: 3.63 MIL/uL — ABNORMAL LOW (ref 4.22–5.81)
RDW: 13.5 % (ref 11.5–15.5)
WBC: 2.6 10*3/uL — ABNORMAL LOW (ref 4.0–10.5)

## 2013-02-03 LAB — COMPREHENSIVE METABOLIC PANEL
ALT: 91 U/L — ABNORMAL HIGH (ref 0–53)
AST: 85 U/L — ABNORMAL HIGH (ref 0–37)
Albumin: 2.8 g/dL — ABNORMAL LOW (ref 3.5–5.2)
Alkaline Phosphatase: 32 U/L — ABNORMAL LOW (ref 39–117)
BUN: 12 mg/dL (ref 6–23)
CO2: 23 mEq/L (ref 19–32)
Calcium: 8.3 mg/dL — ABNORMAL LOW (ref 8.4–10.5)
Chloride: 104 mEq/L (ref 96–112)
Creatinine, Ser: 1.11 mg/dL (ref 0.50–1.35)
GFR calc Af Amer: 85 mL/min — ABNORMAL LOW (ref >90)
GFR calc non Af Amer: 74 mL/min — ABNORMAL LOW (ref >90)
Glucose, Bld: 95 mg/dL (ref 70–99)
Potassium: 3.7 mEq/L (ref 3.5–5.1)
Sodium: 134 mEq/L — ABNORMAL LOW (ref 135–145)
Total Bilirubin: 0.3 mg/dL (ref 0.3–1.2)
Total Protein: 5.3 g/dL — ABNORMAL LOW (ref 6.0–8.3)

## 2013-02-03 LAB — B. BURGDORFI ANTIBODIES: B burgdorferi Ab IgG+IgM: 0.67 {ISR}

## 2013-02-03 MED ORDER — NYSTATIN 100000 UNIT/ML MT SUSP: 5.0000 mL | Freq: Four times a day (QID) | OROMUCOSAL | Status: DC

## 2013-02-03 MED ORDER — DOXYCYCLINE HYCLATE 100 MG PO TABS: 100.0000 mg | ORAL_TABLET | Freq: Two times a day (BID) | ORAL | Status: DC

## 2013-02-03 NOTE — Progress Notes (Signed)
Internal Medicine Attending  Date: 02/03/2013  Patient name: Scott Fleming Medical record number: [id number] Date of birth: [birth year] Age: 57 y.o. Gender: male  I saw and evaluated the patient. I reviewed the resident's note by Dr. Darci Needle and I agree with the resident's findings and plans as documented in her progress note.  Scott Fleming was feeling improved clinically this morning with more energy and a better appetite.  He had no further fevers or sweats and has been tolerating the doxycycline well.  His total WBC count and platelets are increasing.  Lyme titer was negative.  We are awaiting the RMSF and Ehrlichiosis titers but feel he is stable for discharge home with close follow-up this week in the [facility] while he arranges for a new Primary Care Provider.

## 2013-02-03 NOTE — Discharge Summary (Signed)
Name: Scott Fleming MRN: [id number] DOB: 12-28-1955 57 y.o. PCP: No primary provider on file.  Date of Admission: 02/01/2013  9:35 AM Date of Discharge: 02/03/2013 Attending Physician: No att. providers found  Discharge Diagnosis: Principal Problem:   Leukopenia- thought to be 2/2 tick borne illness, titers still pending, but patient with marked response to doxycycline Active Problems:   Fever   Fatigue   Thrombocytopenia, unspecified   Transaminitis   Thrush  Discharge Medications:   Medication List    STOP taking these medications       aspirin 325 MG tablet     HYDROcodone-acetaminophen 5-325 MG per tablet  Commonly known as:  NORCO/VICODIN      TAKE these medications       doxycycline 100 MG tablet  Commonly known as:  VIBRA-TABS  Take 1 tablet (100 mg total) by mouth every 12 (twelve) hours.     levETIRAcetam 500 MG tablet  Commonly known as:  KEPPRA  Take 1 tablet (500 mg total) by mouth 2 (two) times daily.     nystatin 100000 UNIT/ML suspension  Commonly known as:  MYCOSTATIN  Take 5 mLs (500,000 Units total) by mouth 4 (four) times daily. Continue for 7-10 or until symptoms resolve.     Rivaroxaban 20 MG Tabs  Commonly known as:  XARELTO  Take 1 tablet (20 mg total) by mouth daily with supper.        Disposition and follow-up:   ScottDamauri Fleming was discharged from [facility] in Stable condition.  At the hospital follow up visit please address:  1.  Fatigue, fever, generalized weakness (thought to be 2/2 tick borne illness)- monitor for continued clinical improvement as well as result of tick borne titers 2. Leukopenia, thrombocytopenia, mild transaminitis- Please follow up with labs listed below.  2.  Labs / imaging needed at time of follow-up: please repeat CBC and CMP to monitor improvement  3.  Pending labs/ test needing follow-up: RMSF, Lyme, and Ehrlichiosis titers still pending at time of discharge; BCX/UCX NGTD x2days at  time of discharge, please follow up on final result  Follow-up Appointments:     Follow-up Information   Follow up with Darden Palmer, MD On 02/06/2013. (2:45pm)    Contact information:   [address] [place] [address] [phone]       Call Carrie Mew, MD. (As needed)    Contact information:   [address] [place] [address] [phone]       Discharge Instructions: Discharge Orders   Future Appointments Provider Department Dept Phone   02/06/2013 2:45 PM Darden Palmer, MD [facility] [phone]   Future Orders Complete By Expires     Call MD for:  extreme fatigue  As directed     Call MD for:  persistant dizziness or light-headedness  As directed     Call MD for:  temperature >100.4  As directed     Diet - low sodium heart healthy  As directed     Increase activity slowly  As directed        Consultations:  none  Procedures Performed:  Ct Angio Chest Pe W/cm &/or Wo Cm  02/01/2013   *RADIOLOGY REPORT*  Clinical Data: Weakness, chills and history of pulmonary embolism.  CT ANGIOGRAPHY CHEST  Technique:  Multidetector CT imaging of the chest using the standard protocol during bolus administration of intravenous contrast. Multiplanar reconstructed images including MIPs were obtained and  reviewed to evaluate the vascular anatomy.  Contrast: 50mL OMNIPAQUE IOHEXOL 350 MG/ML SOLN  Comparison: None.  Findings: There is no evidence of acute pulmonary embolism.  Lungs show atelectasis at the posterior right base.  No pleural effusions are identified.  There is no evidence of infiltrate or edema.  No nodules or enlarged lymph nodes are seen.  Heart size is normal.  The thoracic aorta is of normal caliber.  Degenerative changes are present in the thoracic spine.  IMPRESSION: No acute findings.  No evidence of pulmonary embolism.   Original Report Authenticated By: Irish Lack, M.D.    Admission HPI:  Mr.  Scott Fleming is a 57yo caucasian man with h/o epilepsy and PE (May 2014, on xarelto) who presents with fever, fatigue, generalized weakness, decreased appetite over the past 3-4 days. Patient states that he has been healthy and increasingly active since his PE in May of this year and has taken his xarelto as prescribed until 2 weeks ago when he went on a trip to [place] and forgot to bring his xarelto, so he didn't take it for 7 days; however, he promptly restarted it when he got back home. Patient notes that for the past 3-4 days he has had fever (up to 101.9), nausea, generalized fatigue and weakness with some SOB with exertion, decreased appetite, and bilateral frontal HA. He also c/o foul smelling, dark yellow-orange colored urine over this same time period, denies increased frequency and dysuria. He has been drinking more water than usual as well, but decreased food intake and has lost 6 lbs over the past week. Patient points out that he has had a removable white film on his togue for the past few days and his wife reports he has some "growths" on his back that have increased in number recently. Denies CP, abd pain, vomiting, changes in vision, numbness/weakness to extremities, facial droop, leg swelling, leg pain. He has been taking ASA 650mg  TID for his fever for the past few days. Patient reports that he removed 2 ticks within the past month and that while he was in [place] he received bites by "many insects." He does not recall seeing any targetoid lesion on his skin. Patient has been so tired recently he cannot walk up stairs without becoming tired and having to sit down. He has been unable to go into work over the past 3 days, which per wife, is very atypical for patient.   In the ED, VS T 99.1, BP 114/72, HR 71, R 21, SpO2 100%. Patient had CBC revealing WBC 1.8, PL 68. CMP showed Cr 1.5 (baseline 1), AST 129, ALT 103. EKG no acute changes. Troponin negative x 1. UA showed amber colored urine with 15  ketones and 100 protein and 100 glucose. Chest CTA negative.    Hospital Course by problem list:   1. Leukopeniawith oral thrush and fever- Patient presented with 3 day history of fever, generalized weakness, fatigue, skin rash, and oral thrush and found to have leukopenia (WBC 1.8), thrombocytopenia (PL 68), mild transaminitis, hyponatremia (Na 130) and prerenal AKI (Cr 1.45, FENa .35%). Patient's blood smear showed atypical leukocytes with mild left shift with large platelets, consistent with increased hematopoesis. Patient reportedly found two ticks on him over the past month and has recent history of frequent outdoor activity in both [place] and [place]. Titers for RMSF, Ehrlichiosis, and Lyme pending at time of discharge. Patient was started on empiric doxycycline 100mg  PO bid. After receiving the doxycycline patient felt remarkably improved  from day of admission. Tick borne illness is leading diagnosis given constellation of patient's clinical presentation, laboratory abnormalities, as well as his marked response to doxycycline therapy. Other items considered on the differential diagnosis include leukemia, myelodysplastic disorders, and other infections including opportunistic infections given his low WBC count. These diagnoses should be explored if patient does not continue to improve. At time of discharge, patient's WBC and platelet count as well as his AKI had improved, hyponatremia had resolved, and LFTs remained stable. Patient was discharged with doxycycline 100mg  bid for total course of 10 days with follow up in [facility] on Thursday.   Discharge Vitals:   BP 120/76  Pulse 60  Temp(Src) 97.5 F (36.4 C) (Oral)  Resp 19  Ht 6' (1.829 m)  Wt 88.5 kg (195 lb 1.7 oz)  BMI 26.46 kg/m2  SpO2 98%  Discharge Labs:  Results for orders placed during the hospital encounter of 02/01/13 (from the past 24 hour(s))  COMPREHENSIVE METABOLIC PANEL     Status: Abnormal   Collection Time     02/03/13  5:00 AM      Result Value Range   Sodium 134 (*) 135 - 145 mEq/L   Potassium 3.7  3.5 - 5.1 mEq/L   Chloride 104  96 - 112 mEq/L   CO2 23  19 - 32 mEq/L   Glucose, Bld 95  70 - 99 mg/dL   BUN 12  6 - 23 mg/dL   Creatinine, Ser 5.40  0.50 - 1.35 mg/dL   Calcium 8.3 (*) 8.4 - 10.5 mg/dL   Total Protein 5.3 (*) 6.0 - 8.3 g/dL   Albumin 2.8 (*) 3.5 - 5.2 g/dL   AST 85 (*) 0 - 37 U/L   ALT 91 (*) 0 - 53 U/L   Alkaline Phosphatase 32 (*) 39 - 117 U/L   Total Bilirubin 0.3  0.3 - 1.2 mg/dL   GFR calc non Af Amer 74 (*) >90 mL/min   GFR calc Af Amer 85 (*) >90 mL/min  CBC WITH DIFFERENTIAL     Status: Abnormal   Collection Time    02/03/13  5:00 AM      Result Value Range   WBC 2.6 (*) 4.0 - 10.5 K/uL   RBC 3.63 (*) 4.22 - 5.81 MIL/uL   Hemoglobin 11.5 (*) 13.0 - 17.0 g/dL   HCT 98.1 (*) 19.1 - 47.8 %   MCV 88.4  78.0 - 100.0 fL   MCH 31.7  26.0 - 34.0 pg   MCHC 35.8  30.0 - 36.0 g/dL   RDW 29.5  62.1 - 30.8 %   Platelets 74 (*) 150 - 400 K/uL   Neutrophils Relative % 25 (*) 43 - 77 %   Lymphocytes Relative 43  12 - 46 %   Monocytes Relative 26 (*) 3 - 12 %   Eosinophils Relative 0  0 - 5 %   Basophils Relative 6 (*) 0 - 1 %   Neutro Abs 0.7 (*) 1.7 - 7.7 K/uL   Lymphs Abs 1.0  0.7 - 4.0 K/uL   Monocytes Absolute 0.7  0.1 - 1.0 K/uL   Eosinophils Absolute 0.0  0.0 - 0.7 K/uL   Basophils Absolute 0.2 (*) 0.0 - 0.1 K/uL   WBC Morphology MILD LEFT SHIFT (1-5% METAS, OCC MYELO, OCC BANDS)     Smear Review LARGE PLATELETS PRESENT      Signed: Windell Hummingbird, MD 02/03/2013, 1:59 PM   Time Spent on Discharge: 45 minutes Services  Ordered on Discharge: none Equipment Ordered on Discharge: none

## 2013-02-03 NOTE — Progress Notes (Signed)
Subjective: Scott Fleming was afebrile overnight and is feeling much better this morning. He slept well and did not have any chills or sweating last night. He denies HA and reports he has increased energy today. He has been taking po liquids well, but still with decreased food intake. He denies blood in his stool or urine. He will try to ambulate more today to assess his energy level.   Objective: Vital signs in last 24 hours: Filed Vitals:   02/02/13 1000 02/02/13 1800 02/02/13 2118 02/03/13 0449  BP: 104/66 112/68 110/70 122/77  Pulse: 68 70 67 60  Temp: 98.8 F (37.1 C) 98.2 F (36.8 C) 97.8 F (36.6 C) 97.8 F (36.6 C)  TempSrc: Oral Oral Oral Oral  Resp: 18 18 18 18   Height:      Weight:    195 lb 1.7 oz (88.5 kg)  SpO2: 98% 98% 100% 100%    Intake/Output Summary (Last 24 hours) at 02/03/13 0716 Last data filed at 02/02/13 1700  Gross per 24 hour  Intake 1321.67 ml  Output    652 ml  Net 669.67 ml   Physical exam General: Patient is alert, cooperative, and in no apparent distress, he is more lively and talkative today HEENT: pupils equal round and reactive to light, vision grossly intact, moist mucous membranes with white film on tongue- slight improvement from yesterday Neck: supple, no cervical or supraclavicular lymphadenopathy  Skin: multiple light brown colored plaques with verrucous appearance and waxy texture to back; scattered blanchable erythematous papules and macules to back, chest and abdomen- however this erythematous rash appears much improved from time of admission Lungs: clear to ascultation bilaterally, normal work of respiration, no wheezes, rales, ronchi Heart: regular rate and rhythm, no murmurs, gallops, or rubs Abdomen: soft, non-tender, non-distended, normal bowel sounds  Extremities: warm extremities, no edema Neurologic: alert & oriented X3, cranial nerves II-XII intact- no facial droop, strength and sensation grossly intact throughout, patient moves  all extremities spontaneously and he is able to stand up and ambulate without difficulty  Lab Results: Basic Metabolic Panel:  Recent Labs Lab 02/02/13 0605 02/03/13 0500  NA 131* 134*  K 3.5 3.7  CL 100 104  CO2 23 23  GLUCOSE 135* 95  BUN 14 12  CREATININE 1.33 1.11  CALCIUM 8.1* 8.3*   Liver Function Tests:  Recent Labs Lab 02/02/13 0605 02/03/13 0500  AST 95* 85*  ALT 88* 91*  ALKPHOS 31* 32*  BILITOT 0.4 0.3  PROT 5.7* 5.3*  ALBUMIN 3.0* 2.8*   CBC:  Recent Labs Lab 02/01/13 1432 02/03/13 0500  WBC 1.5* 2.6*  NEUTROABS 0.9* 0.7*  HGB 13.2 11.5*  HCT 37.2* 32.1*  MCV 89.0 88.4  PLT 69* 74*   Cardiac Enzymes:  POC troponin 0.01  Hemoglobin A1C:  Recent Labs Lab 02/01/13 1432  HGBA1C 5.2   Fasting Lipid Panel: No results found for this basename: CHOL, HDL, LDLCALC, TRIG, CHOLHDL, LDLDIRECT,  in the last 168 hours Thyroid Function Tests:  Recent Labs Lab 02/01/13 1432  TSH 0.971   Coagulation:  Recent Labs Lab 02/01/13 1432  LABPROT 20.5*  INR 1.82*   Urinalysis:  Recent Labs Lab 02/01/13 1219  COLORURINE AMBER*  LABSPEC 1.044*  PHURINE 5.0  GLUCOSEU 100*  HGBUR TRACE*  BILIRUBINUR NEGATIVE  KETONESUR 15*  PROTEINUR 100*  UROBILINOGEN 0.2  NITRITE NEGATIVE  LEUKOCYTESUR NEGATIVE   Misc. Labs: Uprotein- 60.4 Uosm- 699 Una 63 Ucr-199  HIV NR RPR NR TSH .971  Ucx NGTD Bcx NGTD  Micro Results: Recent Results (from the past 240 hour(s))  URINE CULTURE     Status: None   Collection Time    02/01/13 12:19 PM      Result Value Range Status   Specimen Description URINE, RANDOM   Final   Special Requests NONE   Final   Culture  Setup Time 02/01/2013 20:56   Final   Colony Count NO GROWTH   Final   Culture NO GROWTH   Final   Report Status 02/02/2013 FINAL   Final  CULTURE, BLOOD (ROUTINE X 2)     Status: None   Collection Time    02/01/13  2:35 PM      Result Value Range Status   Specimen Description BLOOD LEFT  ARM   Final   Special Requests BOTTLES DRAWN AEROBIC AND ANAEROBIC 10CC   Final   Culture  Setup Time 02/01/2013 22:15   Final   Culture     Final   Value:        BLOOD CULTURE RECEIVED NO GROWTH TO DATE CULTURE WILL BE HELD FOR 5 DAYS BEFORE ISSUING A FINAL NEGATIVE REPORT   Report Status PENDING   Incomplete  CULTURE, BLOOD (ROUTINE X 2)     Status: None   Collection Time    02/01/13  2:50 PM      Result Value Range Status   Specimen Description BLOOD RIGHT HAND   Final   Special Requests     Final   Value: BOTTLES DRAWN AEROBIC AND ANAEROBIC 10CC BLUE, 5CC RED   Culture  Setup Time 02/01/2013 22:14   Final   Culture     Final   Value:        BLOOD CULTURE RECEIVED NO GROWTH TO DATE CULTURE WILL BE HELD FOR 5 DAYS BEFORE ISSUING A FINAL NEGATIVE REPORT   Report Status PENDING   Incomplete   Studies/Results: Ct Angio Chest Pe W/cm &/or Wo Cm  02/01/2013   *RADIOLOGY REPORT*  Clinical Data: Weakness, chills and history of pulmonary embolism.  CT ANGIOGRAPHY CHEST  Technique:  Multidetector CT imaging of the chest using the standard protocol during bolus administration of intravenous contrast. Multiplanar reconstructed images including MIPs were obtained and reviewed to evaluate the vascular anatomy.  Contrast: 50mL OMNIPAQUE IOHEXOL 350 MG/ML SOLN  Comparison: None.  Findings: There is no evidence of acute pulmonary embolism.  Lungs show atelectasis at the posterior right base.  No pleural effusions are identified.  There is no evidence of infiltrate or edema.  No nodules or enlarged lymph nodes are seen.  Heart size is normal.  The thoracic aorta is of normal caliber.  Degenerative changes are present in the thoracic spine.  IMPRESSION: No acute findings.  No evidence of pulmonary embolism.   Original Report Authenticated By: Irish Lack, M.D.   Medications: I have reviewed the patient's current medications. Scheduled Meds: . doxycycline  100 mg Oral Q12H  . levETIRAcetam  500 mg Oral  BID  . nystatin  5 mL Oral QID  . Rivaroxaban  20 mg Oral Q supper   Continuous Infusions: . sodium chloride 100 mL/hr at 02/02/13 2209   PRN Meds:.acetaminophen, HYDROcodone-acetaminophen  Assessment/Plan: Scott Fleming is a 57yo caucasian man with h/o epilepsy and PE (May 2014, on xarelto) who presents with fever, fatigue, generalized weakness, decreased appetite and newly found leukopenia, thrombocytopenia, mild transaminitis, hyponatremia and increased creatinine suspicious for tick borne illness vs malignancy vs other infection vs  symptomatic hyponatremia.   # Leukopenia w/ oral thrush and fever: Patient afebrile overnight. His leukopenia and thrombocytopenia have improved to 2.6 and 74 respectively. Given the constellation of symptoms and laboratory abnormalities (transaminitis, hyponatremia, leukopenia, thrombocytopenia) along with h/o tick and other insect exposure, I favor tick borne illness at this time. RMSF and Lyme disease are known to cause blood cell dyscrasias as well as increase in LFTs. RMSF is known to cause hyponatremia 2/2 intravascular volume depletion 2/2 vascultitis and leaky blood vessels. Intravascular volume depletion may also explain the rise in Cr as well as a prerenal FeNa. Other causes of blood cell dyscrasias with concomitant electrolyte and LFT abnormalities may be explained by certain types of leukemia (though would expect to have a drop in all cell lines with marrow infiltration), myelodysplastic disorders, and other infections including opportunistic infections given his low WBC count. Patient describes having increased skin lesions recently that on physical exam are c/w seborrheic keratoses that can increase in number rapidly in malignancy. However, given patient's drastic improvement on doxycycline, I feel comfortable discharging patient today with a close follow up in our clinic later this week.  -doxycycline 100mg  bid empirically for possible tick borne illness  (started 7/12)  -RMSF, Ehrlichiosis, and Lyme titers - per lab, should result today -chest CT done in ED was negative for PNA and PE  -blood smear- showed large platelets with atypical lymphocytes c/w increased hematopoesis  -UCx, BCx both show NGTD -nystatin swish and swallow for oral thrush- oral thrush improving -HIV, RPR NR -TSH .971   # AKI and Hyponatremia: AKI is improving today with Cr decreasing to 1.1 from 1.45 on admission. Likely hypovolemic hyponatremia 2/2 decreased PO intake. Posm is low-normal, but Uosm is >100, so this is not caused by polydipsia. Since patient appears to have prerenal etiology for AKI (FeNa .35%), patient likely has hyponatremia from his volume depletion. RMSF can cause hyponatremia as well, which is another potential cause. -patient taking PO fluids well, d/c IVF today  # Transaminitis: Transaminitis continues to be mild, but stable today. INR is significantly elevated, however, this is thought to be 2/2 his xarelto use, which can alter INR. Etiology of transaminitis  is unclear at this point, but may be 2/2 tick borne illness. If this does not resolve, will consider checking titers for hep B, hep A, hep C.   # Increased Anion Gap- Resolved. Patient had AG 13 on admission and is stable at 7 since yesterday. Likely 2/2 starvation ketoacidosis 2/2 decreased PO intake recently. Originally thought to be due to increased ASA use, but salicylate level <2.0.  -IVF: d/c today  # Polydipsia w/ glucosuria: Originally, I was suspicious for diabetes mellitus given the recent fatigue, but HbA1c 5.2%. Patient had blood glucose of 116 on admission and repeat 135- likely due to increase in counteregulatory hormones given his recent illness. BG down to 95 today.  # Epilepsy- continue home keppra   # VTE: SCDs and xarelto   # Diet: regular   Code status: Full  Dispo: Discharge today.  The patient does have a current PCP (No primary provider on file.) and does not need  an [facility] hospital follow-up appointment after discharge. PCP in [place].   The patient does not have transportation limitations that hinder transportation to clinic appointments.  .Services Needed at time of discharge: Y = Yes, Blank = No PT:   OT:   RN:   Equipment:   Other:     LOS: 2 days  Windell Hummingbird, MD 02/03/2013, 7:16 AM

## 2013-02-04 ENCOUNTER — Telehealth: Payer: Self-pay | Admitting: *Deleted

## 2013-02-04 LAB — ROCKY MTN SPOTTED FVR AB, IGM-BLOOD: RMSF IgM: 1.54 IV (ref 0.00–0.89)

## 2013-02-04 NOTE — Telephone Encounter (Signed)
Please also forward to inpatient team who were taking care of him.

## 2013-02-04 NOTE — Telephone Encounter (Signed)
[facility] lab calls with result:                                           Rocky mtn spotted fever igm: +POSITIVE+  VALUE: 1.54

## 2013-02-05 NOTE — Discharge Summary (Signed)
RMSF: Positive Lyme: Negative Ehrlichiosis: Pending  Patient called with results and advised to complete course of doxycycline as prescribed.

## 2013-02-05 NOTE — Telephone Encounter (Signed)
I was able to get a hold of Scott Fleming at work [phone] and inform him of the diagnosis.  He is feeling much improved and, obviously, is back at work.

## 2013-02-06 ENCOUNTER — Encounter: Payer: Self-pay | Admitting: Internal Medicine

## 2013-02-06 ENCOUNTER — Ambulatory Visit: Payer: 59 | Admitting: Internal Medicine

## 2013-02-06 LAB — EHRLICHIA ANTIBODY PANEL
E chaffeensis (HGE) Ab, IgG: <1:64 {titer}
E chaffeensis (HGE) Ab, IgM: <1:20 {titer}

## 2013-02-07 LAB — CULTURE, BLOOD (ROUTINE X 2)
Culture: NO GROWTH
Culture: NO GROWTH

## 2013-04-08 ENCOUNTER — Emergency Department: Payer: Self-pay | Admitting: Emergency Medicine

## 2013-04-08 LAB — CBC WITH DIFFERENTIAL/PLATELET
Basophil #: 0.1 10*3/uL (ref 0.0–0.1)
Basophil %: 1.2 %
Eosinophil #: 0 10*3/uL (ref 0.0–0.7)
Eosinophil %: 1 %
HCT: 39.4 % — ABNORMAL LOW (ref 40.0–52.0)
HGB: 13.7 g/dL (ref 13.0–18.0)
Lymphocyte #: 1.8 10*3/uL (ref 1.0–3.6)
Lymphocyte %: 39.1 %
MCH: 32.4 pg (ref 26.0–34.0)
MCHC: 34.7 g/dL (ref 32.0–36.0)
MCV: 93 fL (ref 80–100)
Monocyte #: 0.3 x10 3/mm (ref 0.2–1.0)
Monocyte %: 7.3 %
Neutrophil #: 2.4 10*3/uL (ref 1.4–6.5)
Neutrophil %: 51.4 %
Platelet: 211 10*3/uL (ref 150–440)
RBC: 4.22 10*6/uL — ABNORMAL LOW (ref 4.40–5.90)
RDW: 13.6 % (ref 11.5–14.5)
WBC: 4.7 10*3/uL (ref 3.8–10.6)

## 2013-04-08 LAB — BASIC METABOLIC PANEL
Anion Gap: 7 (ref 7–16)
BUN: 14 mg/dL (ref 7–18)
Calcium, Total: 8.9 mg/dL (ref 8.5–10.1)
Chloride: 108 mmol/L — ABNORMAL HIGH (ref 98–107)
Co2: 22 mmol/L (ref 21–32)
Creatinine: 1.21 mg/dL (ref 0.60–1.30)
EGFR (African American): >60
EGFR (Non-African Amer.): >60
Glucose: 141 mg/dL — ABNORMAL HIGH (ref 65–99)
Osmolality: 277 (ref 275–301)
Potassium: 4.4 mmol/L (ref 3.5–5.1)
Sodium: 137 mmol/L (ref 136–145)

## 2014-06-02 IMAGING — CR DG CHEST 2V
2 series · 2 of 2 positions shown · non-contrast
Comparison: None

CLINICAL DATA: Right side rib pain, chest pain for 3 days,
shortness of breath

CHEST - 2 VIEW

[w chest pa]
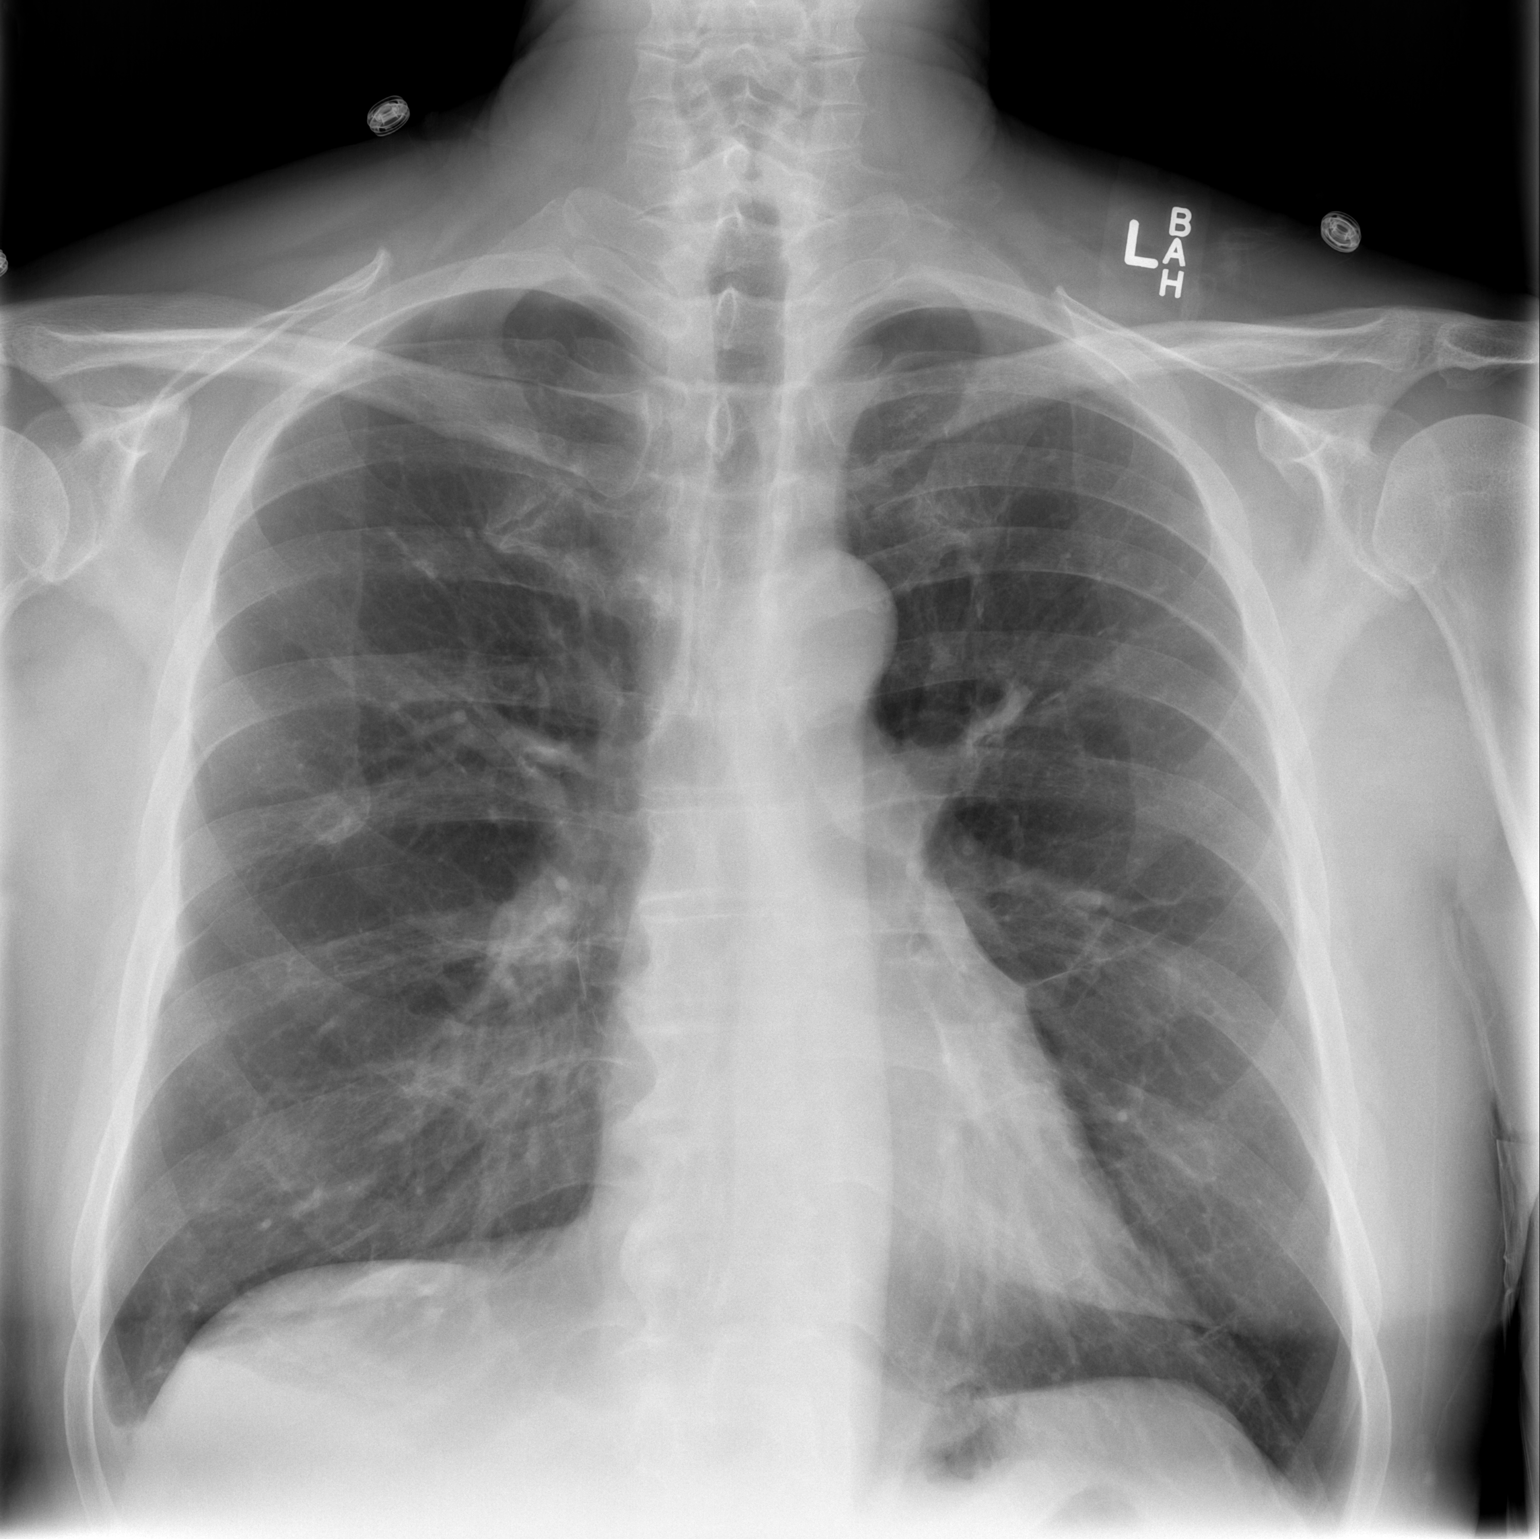

[w chest lat]
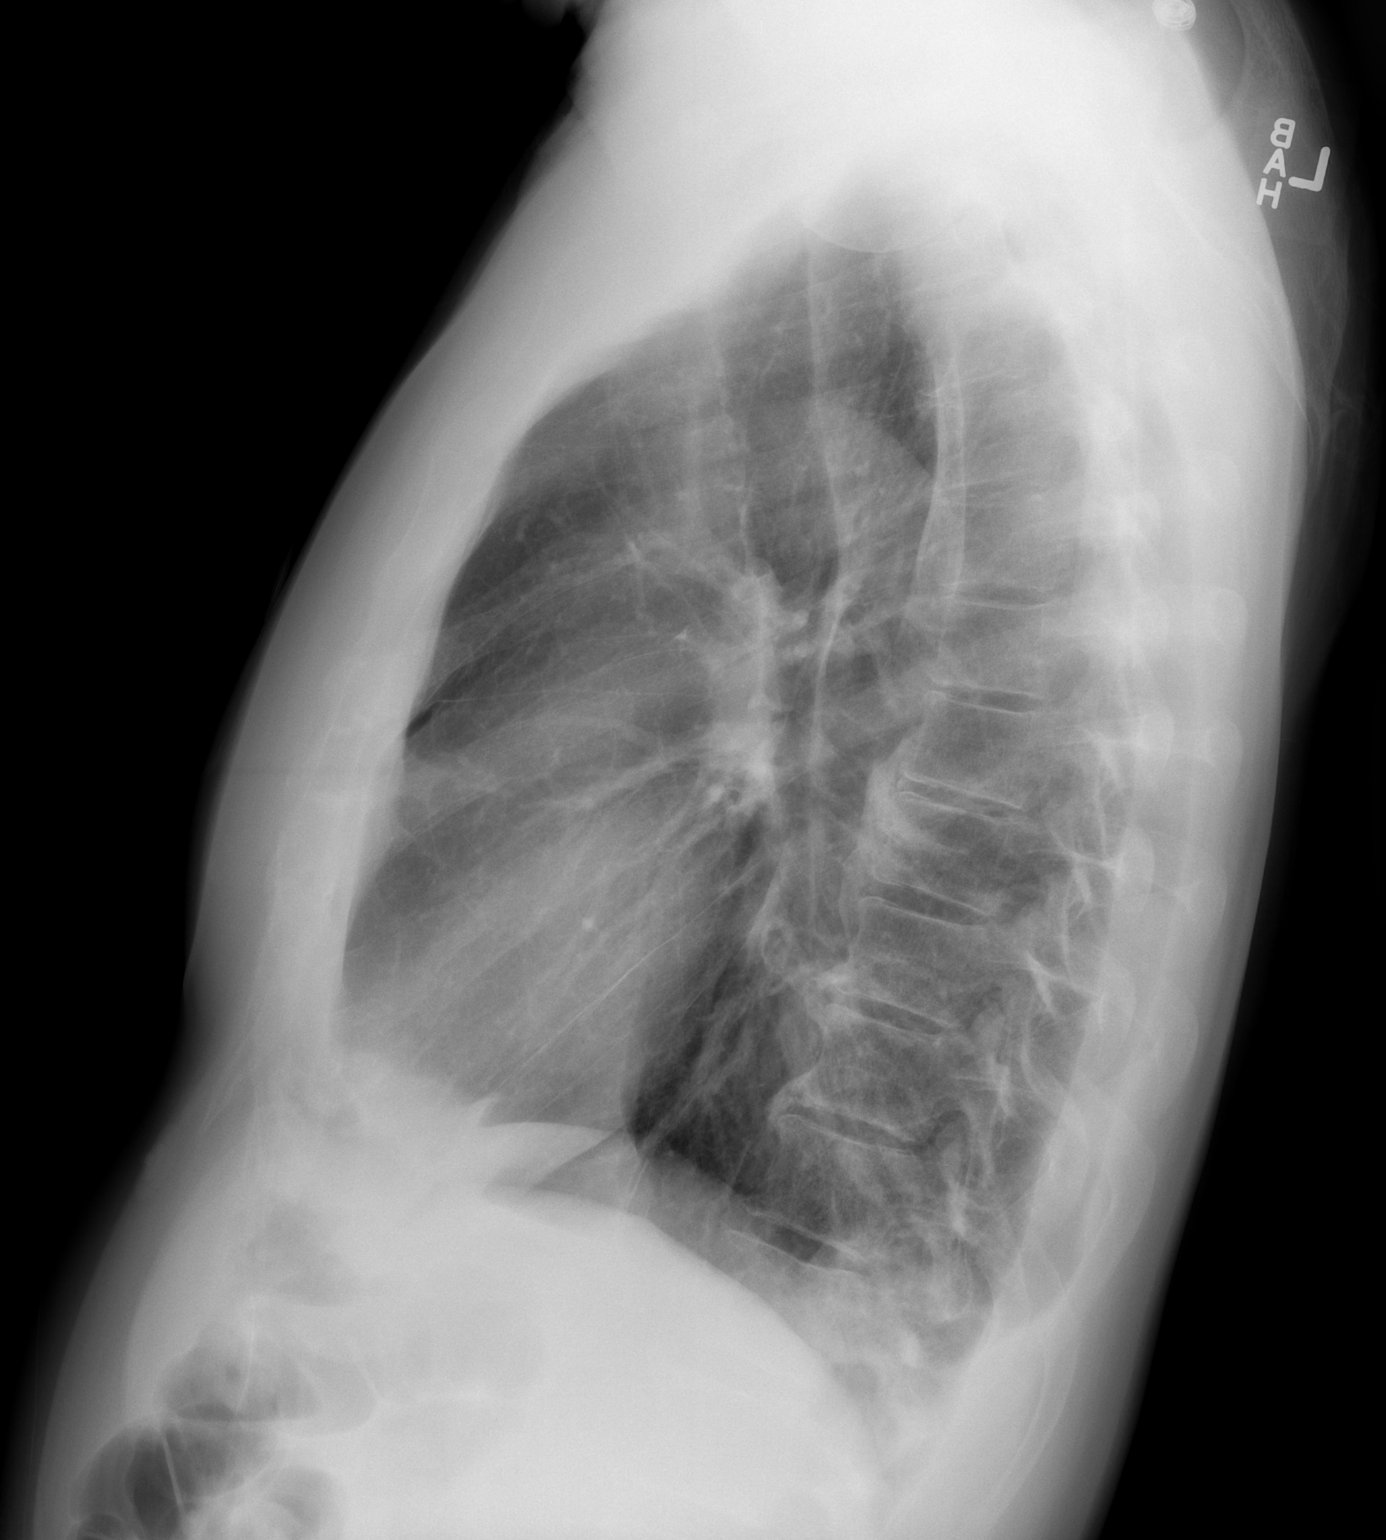

[2 of 2 positions shown; findings below may reference images not displayed]

FINDINGS: Normal heart size, mediastinal contours, and pulmonary vascularity.
Minimal right pleural effusion and basilar atelectasis.
Lungs otherwise clear.
No segmental consolidation or pneumothorax.
Scattered endplate spur formation thoracic spine.
IMPRESSION: Minimal right basilar effusion and atelectasis.

## 2014-06-02 IMAGING — CT CT ANGIO CHEST
1 of 10 series · 18 of 38 positions shown · IV contrast (APPLIED)
Comparison: None

CLINICAL DATA: Pain under right ribs started 3 days ago, increases
when the patient takes a deep breath

CT ANGIOGRAPHY CHEST
TECHNIQUE: Multidetector CT imaging of the chest using the
standard protocol during bolus administration of intravenous
contrast. Multiplanar reconstructed images including MIPs were
obtained and reviewed to evaluate the vascular anatomy.
Contrast: 100mL OMNIPAQUE IOHEXOL 350 MG/ML SOLN

[Series 5: pulm embolism 3.0 b25f st · axial · 0.66mm/px · z∈[-290,-29]mm · 18 of 99 slices shown]
[im 6/99  lung]
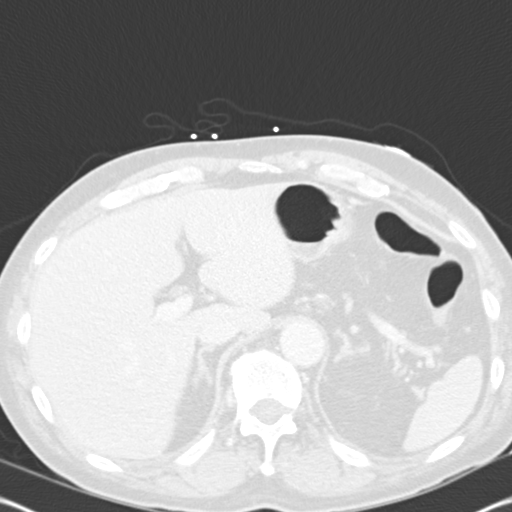
[im 11/99  mediastinal]
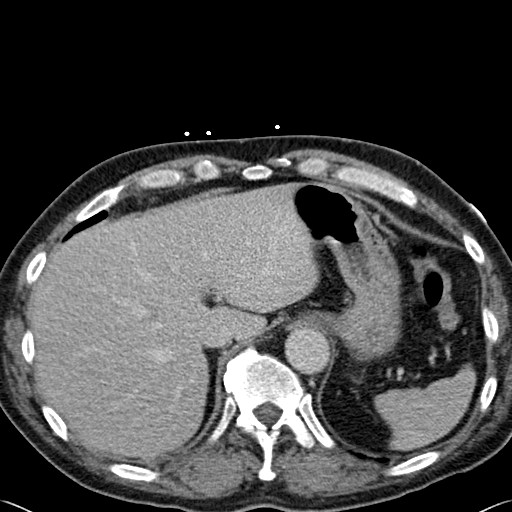
[im 16/99  lung]
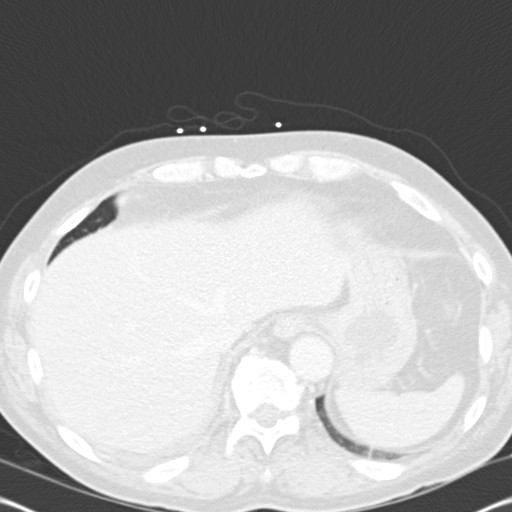
[im 21/99  mediastinal]
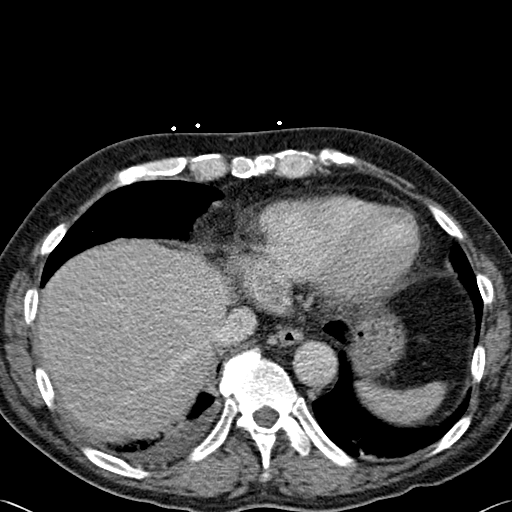
[im 26/99  lung]
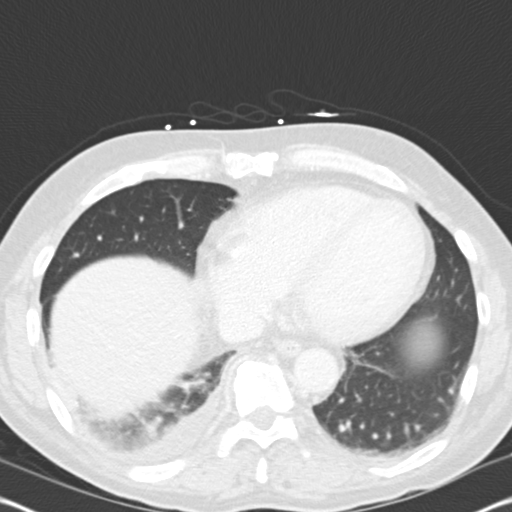
[im 31/99  mediastinal]
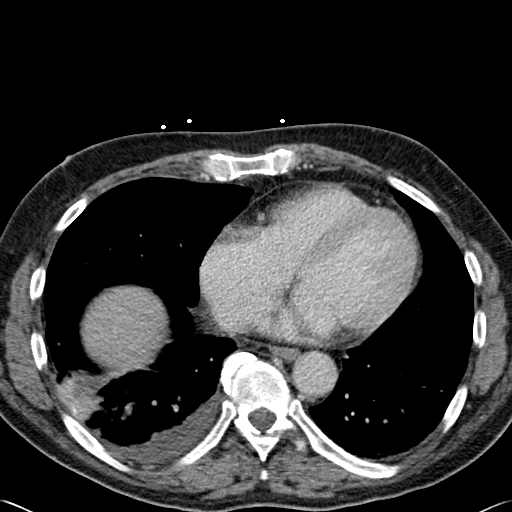
[im 37/99  lung]
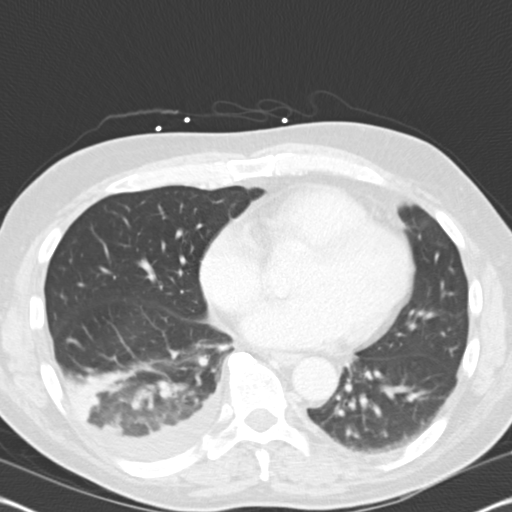
[im 42/99  mediastinal]
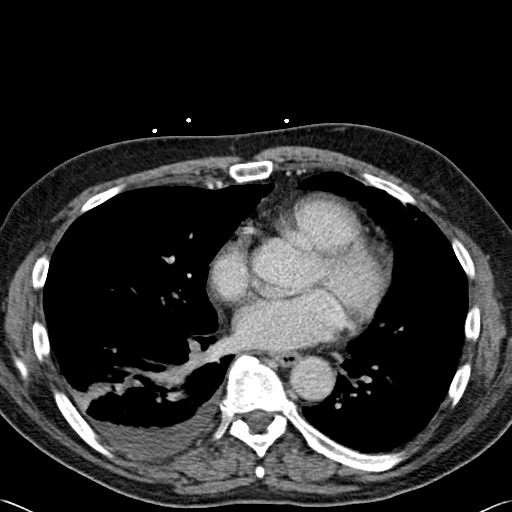
[im 47/99  lung]
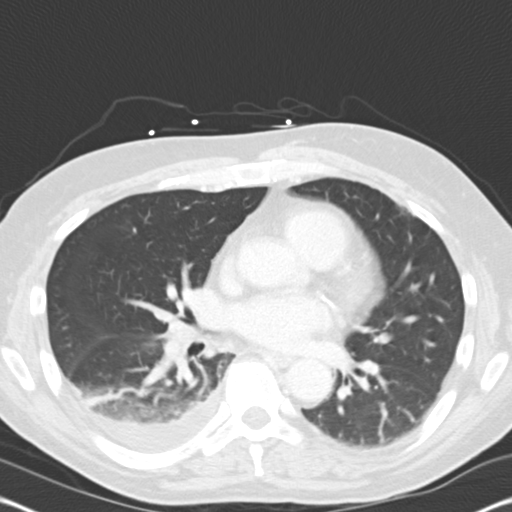
[im 52/99  mediastinal]
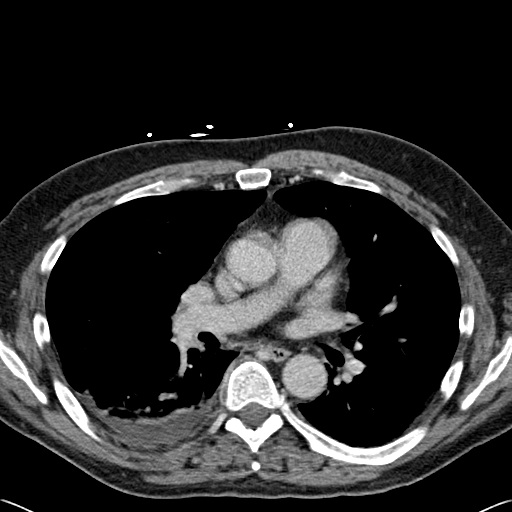
[im 57/99  lung]
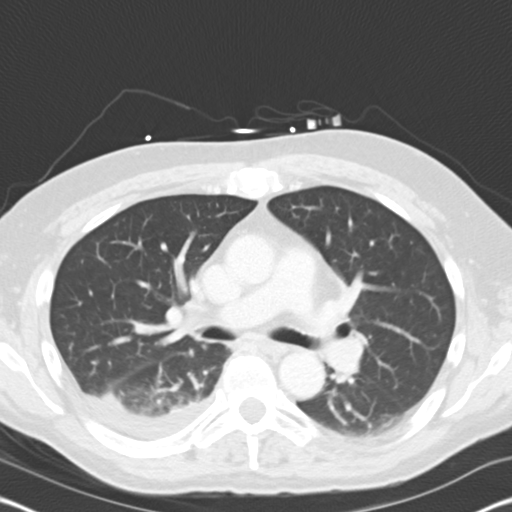
[im 62/99  mediastinal]
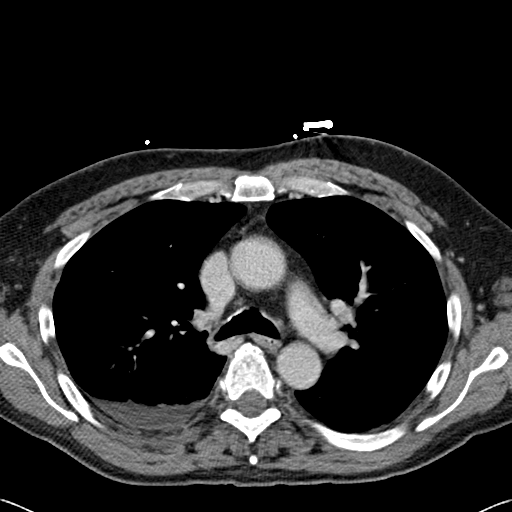
[im 68/99  lung]
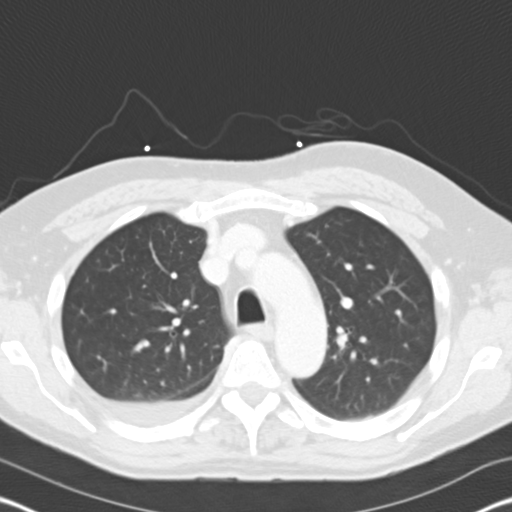
[im 73/99  mediastinal]
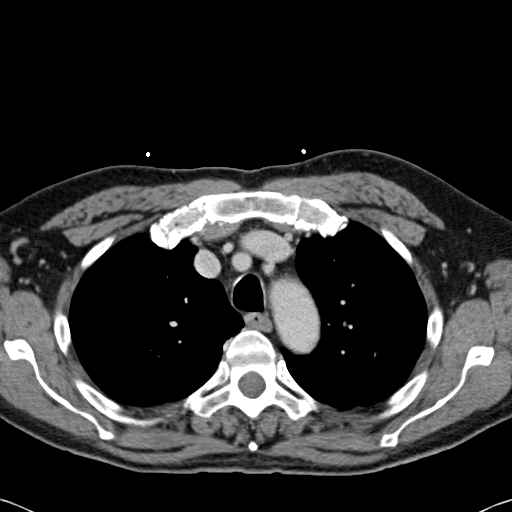
[im 78/99  lung]
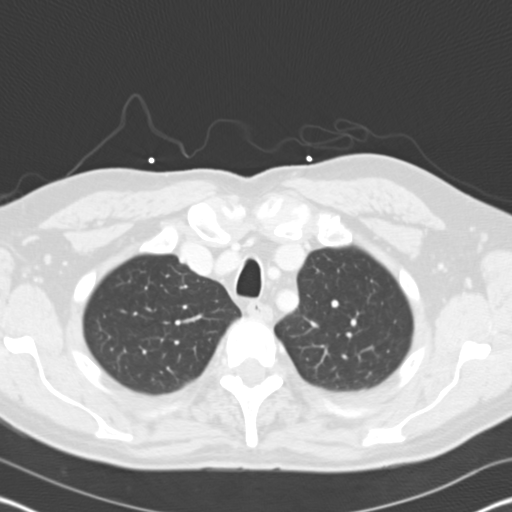
[im 83/99  mediastinal]
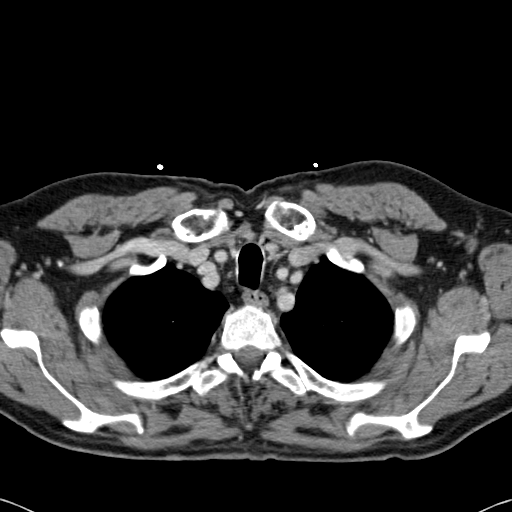
[im 88/99  lung]
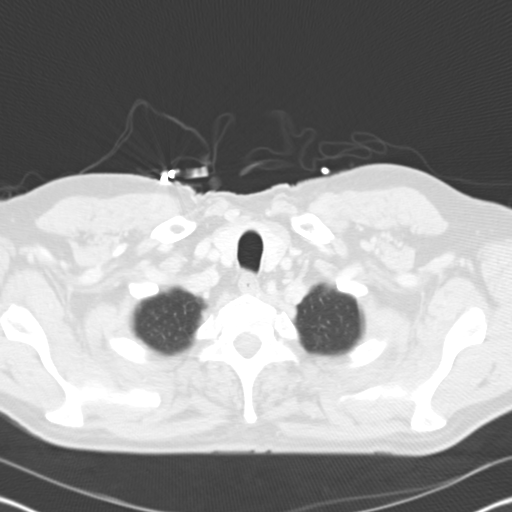
[im 93/99  mediastinal]
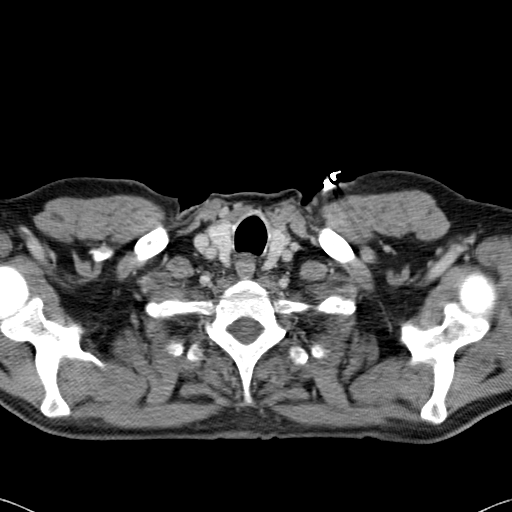

[18 of 38 positions shown; findings below may reference images not displayed]

FINDINGS: Technical failure of scanner at mid point of CTA imaging; scanner
reset and repeat imaging of the entire chest was obtained at a
slightly delayed interval from optimal contrast opacification.
Due to diagnostic findings identifiable on the obtained images,
repeat contrast injection and repeat imaging was not required.
Excellent arterial phase pulmonary arterial enhancement is seen
through the main pulmonary artery bifurcation.

Filling defect identified at bifurcation of the right upper lobe
pulmonary artery consistent with a pulmonary embolus.
Additionally, the delayed image set shows a filling defect within
the descending interlobar right pulmonary artery extending into the
right lower lobe consistent with an additional pulmonary embolus.
No definite left side pulmonary emboli identified.
Right pleural effusion with atelectasis in right lower lobe.
Minimal dependent atelectasis posteriorly in left lower lobe.
Upper lungs clear bilaterally.
Scattered endplate spur formation thoracic spine.
IMPRESSION: Positive exam for presence of pulmonary emboli within right upper
and right lower lobe pulmonary arteries as above.

Critical Value/emergent results were called by telephone at the
time of interpretation on 12/09/2012 at 7111 hours to Dr. Moolman,
who verbally acknowledged these results.

## 2014-11-26 ENCOUNTER — Emergency Department (HOSPITAL_COMMUNITY)
Admission: EM | Admit: 2014-11-26 | Discharge: 2014-11-26 | Disposition: A | Discharge status: Home / Self Care | Payer: 59 | Attending: Emergency Medicine | Admitting: Emergency Medicine

## 2014-11-26 ENCOUNTER — Emergency Department (HOSPITAL_COMMUNITY): Payer: 59

## 2014-11-26 ENCOUNTER — Encounter (HOSPITAL_COMMUNITY): Payer: Self-pay

## 2014-11-26 DIAGNOSIS — Z8619 Personal history of other infectious and parasitic diseases: Secondary | ICD-10-CM | POA: Diagnosis not present

## 2014-11-26 DIAGNOSIS — Z880 Allergy status to penicillin: Secondary | ICD-10-CM | POA: Insufficient documentation

## 2014-11-26 DIAGNOSIS — G40909 Epilepsy, unspecified, not intractable, without status epilepticus: Secondary | ICD-10-CM | POA: Insufficient documentation

## 2014-11-26 DIAGNOSIS — R569 Unspecified convulsions: Secondary | ICD-10-CM | POA: Diagnosis not present

## 2014-11-26 DIAGNOSIS — R4182 Altered mental status, unspecified: Secondary | ICD-10-CM | POA: Diagnosis present

## 2014-11-26 DIAGNOSIS — Z86711 Personal history of pulmonary embolism: Secondary | ICD-10-CM | POA: Insufficient documentation

## 2014-11-26 HISTORY — DX: Spotted fever due to Rickettsia rickettsii: A77.0

## 2014-11-26 LAB — RAPID URINE DRUG SCREEN, HOSP PERFORMED
Amphetamines: NOT DETECTED
Barbiturates: NOT DETECTED
Benzodiazepines: NOT DETECTED
Cocaine: NOT DETECTED
Opiates: NOT DETECTED
Tetrahydrocannabinol: NOT DETECTED

## 2014-11-26 LAB — I-STAT CHEM 8, ED
BUN: 14 mg/dL (ref 6–20)
Calcium, Ion: 1.18 mmol/L (ref 1.12–1.23)
Chloride: 104 mmol/L (ref 101–111)
Creatinine, Ser: 1.1 mg/dL (ref 0.61–1.24)
Glucose, Bld: 118 mg/dL — ABNORMAL HIGH (ref 70–99)
HCT: 42 % (ref 39.0–52.0)
Hemoglobin: 14.3 g/dL (ref 13.0–17.0)
Potassium: 4.2 mmol/L (ref 3.5–5.1)
Sodium: 139 mmol/L (ref 135–145)
TCO2: 21 mmol/L (ref 0–100)

## 2014-11-26 LAB — URINALYSIS, ROUTINE W REFLEX MICROSCOPIC
Bilirubin Urine: NEGATIVE
Glucose, UA: 100 mg/dL — AB
Hgb urine dipstick: NEGATIVE
Ketones, ur: NEGATIVE mg/dL
Leukocytes, UA: NEGATIVE
Nitrite: NEGATIVE
Protein, ur: NEGATIVE mg/dL
Specific Gravity, Urine: 1.016 (ref 1.005–1.030)
Urobilinogen, UA: 0.2 mg/dL (ref 0.0–1.0)
pH: 5 (ref 5.0–8.0)

## 2014-11-26 LAB — I-STAT TROPONIN, ED: Troponin i, poc: 0 ng/mL (ref 0.00–0.08)

## 2014-11-26 LAB — COMPREHENSIVE METABOLIC PANEL
ALT: 18 U/L (ref 17–63)
AST: 24 U/L (ref 15–41)
Albumin: 4.3 g/dL (ref 3.5–5.0)
Alkaline Phosphatase: 36 U/L — ABNORMAL LOW (ref 38–126)
Anion gap: 9 (ref 5–15)
BUN: 11 mg/dL (ref 6–20)
CO2: 23 mmol/L (ref 22–32)
Calcium: 9.1 mg/dL (ref 8.9–10.3)
Chloride: 106 mmol/L (ref 101–111)
Creatinine, Ser: 1.2 mg/dL (ref 0.61–1.24)
GFR calc Af Amer: >60 mL/min (ref >60)
GFR calc non Af Amer: >60 mL/min (ref >60)
Glucose, Bld: 120 mg/dL — ABNORMAL HIGH (ref 70–99)
Potassium: 4.1 mmol/L (ref 3.5–5.1)
Sodium: 138 mmol/L (ref 135–145)
Total Bilirubin: 0.5 mg/dL (ref 0.3–1.2)
Total Protein: 7.3 g/dL (ref 6.5–8.1)

## 2014-11-26 LAB — CBC
HCT: 39.8 % (ref 39.0–52.0)
Hemoglobin: 13.7 g/dL (ref 13.0–17.0)
MCH: 32 pg (ref 26.0–34.0)
MCHC: 34.4 g/dL (ref 30.0–36.0)
MCV: 93 fL (ref 78.0–100.0)
Platelets: 188 10*3/uL (ref 150–400)
RBC: 4.28 MIL/uL (ref 4.22–5.81)
RDW: 13.2 % (ref 11.5–15.5)
WBC: 7.4 10*3/uL (ref 4.0–10.5)

## 2014-11-26 LAB — PROTIME-INR
INR: 1.09 (ref 0.00–1.49)
Prothrombin Time: 14.2 seconds (ref 11.6–15.2)

## 2014-11-26 LAB — TROPONIN I: Troponin I: <0.03 ng/mL (ref <0.031)

## 2014-11-26 LAB — DIFFERENTIAL
Basophils Absolute: 0 10*3/uL (ref 0.0–0.1)
Basophils Relative: 0 % (ref 0–1)
Eosinophils Absolute: 0 10*3/uL (ref 0.0–0.7)
Eosinophils Relative: 0 % (ref 0–5)
Lymphocytes Relative: 8 % — ABNORMAL LOW (ref 12–46)
Lymphs Abs: 0.6 10*3/uL — ABNORMAL LOW (ref 0.7–4.0)
Monocytes Absolute: 0.4 10*3/uL (ref 0.1–1.0)
Monocytes Relative: 5 % (ref 3–12)
Neutro Abs: 6.4 10*3/uL (ref 1.7–7.7)
Neutrophils Relative %: 87 % — ABNORMAL HIGH (ref 43–77)

## 2014-11-26 LAB — APTT: aPTT: 24 seconds (ref 24–37)

## 2014-11-26 LAB — ETHANOL: Alcohol, Ethyl (B): <5 mg/dL (ref <5)

## 2014-11-26 MED ORDER — LEVETIRACETAM 500 MG PO TABS: 1500.0000 mg | ORAL_TABLET | Freq: Two times a day (BID) | ORAL | Status: DC

## 2014-11-26 MED ORDER — ASPIRIN 81 MG PO CHEW: 81.0000 mg | CHEWABLE_TABLET | Freq: Every day | ORAL | Status: DC

## 2014-11-26 MED ORDER — ASPIRIN 81 MG PO CHEW
81.0000 mg | CHEWABLE_TABLET | Freq: Once | ORAL | Status: AC
  Administered 2014-11-26: 81 mg via ORAL
  Filled 2014-11-26: qty 1

## 2014-11-26 NOTE — ED Notes (Signed)
Pt was sitting at his desk this morning at 1000 and felt disoriented and lost track of where he was and thought he was drooling on himself. He looked down and noticed a wet patch on his sweater. Pt reporting a headache across the front of his head at this time.

## 2014-11-26 NOTE — Discharge Instructions (Signed)

## 2014-11-26 NOTE — ED Provider Notes (Signed)
CSN: [id number]     Arrival date & time 11/26/14  1341 History   First MD Initiated Contact with Patient 11/26/14 1431     Chief Complaint  Patient presents with  . Drooling  . Altered Mental Status     (Consider location/radiation/quality/duration/timing/severity/associated sxs/prior Treatment) HPI The patient was working at his computer and he had an episode whereby he felt somewhat disoriented at approximately 10 AM. He states he then noted that he had drooled on the right side of his sweater. He was concerned and he called his wife. His wife reports on the phone he seemed a little disoriented and was having difficulty recalling what he had done yesterday which he normally would. There was no focal weakness numbness or tingling of the patient can recall. Fall out of his chair during this episode. He denies it was preceded by any headache or chest pain. He reports after was all over he has a vague, mild frontal headache. He does have a seizure disorder. He reports he's been on Keppra and his last seizure was approximately 2 years ago. He is also had blood clots twice in his life. One was in the lower extremities approximately 20 years ago and one was in the lungs approximately 2 years ago. Taking any anticoagulant medications without specific instruction from his doctor. He has not been having chest pain or lower shotty swelling. Past Medical History  Diagnosis Date  . Seizures   . Pulmonary embolism   . [place] spotted fever    Past Surgical History  Procedure Laterality Date  . Skin cancer removed     No family history on file. History  Substance Use Topics  . Smoking status: Never Smoker   . Smokeless tobacco: Not on file  . Alcohol Use: 1.8 oz/week    3 Cans of beer per week     Comment: occ    Review of Systems  10 Systems reviewed and are negative for acute change except as noted in the HPI.   Allergies  Penicillins  Home Medications   Prior to Admission  medications   Medication Sig Start Date End Date Taking? Authorizing Provider  aspirin 81 MG chewable tablet Chew 1 tablet (81 mg total) by mouth daily. 11/26/14   Arby BarretteMarcy Sarabella Caprio, MD  doxycycline (VIBRA-TABS) 100 MG tablet Take 1 tablet (100 mg total) by mouth every 12 (twelve) hours. Patient not taking: Reported on 11/26/2014 02/03/13   Coolidge Breezeachel E Chikowski, MD  levETIRAcetam (KEPPRA) 500 MG tablet Take 3 tablets (1,500 mg total) by mouth 2 (two) times daily. 11/26/14   Arby BarretteMarcy Kimberl Vig, MD  nystatin (MYCOSTATIN) 100000 UNIT/ML suspension Take 5 mLs (500,000 Units total) by mouth 4 (four) times daily. Continue for 7-10 or until symptoms resolve. Patient not taking: Reported on 11/26/2014 02/03/13   Coolidge Breezeachel E Chikowski, MD  Rivaroxaban (XARELTO) 20 MG TABS Take 1 tablet (20 mg total) by mouth daily with supper. Patient not taking: Reported on 11/26/2014 12/31/12   Belkys A Regalado, MD   BP 157/79 mmHg  Pulse 79  Temp(Src) 97.8 F (36.6 C) (Oral)  Resp 18  SpO2 97% Physical Exam  Constitutional: He is oriented to person, place, and time. He appears well-developed and well-nourished.  HENT:  Head: Normocephalic and atraumatic.  Eyes: EOM are normal. Pupils are equal, round, and reactive to light.  Neck: Neck supple.  Cardiovascular: Normal rate, regular rhythm, normal heart sounds and intact distal pulses.   Pulmonary/Chest: Effort normal and breath sounds normal.  Abdominal: Soft. Bowel sounds are normal. He exhibits no distension. There is no tenderness.  Musculoskeletal: Normal range of motion. He exhibits no edema.  Neurological: He is alert and oriented to person, place, and time. He has normal strength. No cranial nerve deficit. He exhibits normal muscle tone. Coordination normal. GCS eye subscore is 4. GCS verbal subscore is 5. GCS motor subscore is 6.  Mental status is clear. Patient is alert and oriented 3. He has normal finger-nose examination. He has no pronator drift. He has normal lower  extremity strength flexion and extension as well as ability to hold each leg independently off the bed for greater than 5 seconds.  Skin: Skin is warm, dry and intact.  Psychiatric: He has a normal mood and affect.    ED Course  Procedures (including critical care time) Labs Review Labs Reviewed  DIFFERENTIAL - Abnormal; Notable for the following:    Neutrophils Relative % 87 (*)    Lymphocytes Relative 8 (*)    Lymphs Abs 0.6 (*)    All other components within normal limits  COMPREHENSIVE METABOLIC PANEL - Abnormal; Notable for the following:    Glucose, Bld 120 (*)    Alkaline Phosphatase 36 (*)    All other components within normal limits  I-STAT CHEM 8, ED - Abnormal; Notable for the following:    Glucose, Bld 118 (*)    All other components within normal limits  PROTIME-INR  APTT  CBC  ETHANOL  URINE RAPID DRUG SCREEN (HOSP PERFORMED)  URINALYSIS, ROUTINE W REFLEX MICROSCOPIC  TROPONIN I  B. BURGDORFI ANTIBODIES  ROCKY MTN SPOTTED FVR ABS PNL(IGG+IGM)  I-STAT TROPOININ, ED  CBG MONITORING, ED    Imaging Review Ct Head (brain) Wo Contrast  11/26/2014   CLINICAL DATA:  Confusion for 1 day  EXAM: CT HEAD WITHOUT CONTRAST  TECHNIQUE: Contiguous axial images were obtained from the base of the skull through the vertex without intravenous contrast.  COMPARISON:  April 08, 2013  FINDINGS: The ventricles are normal in size and configuration. Prominence of the cisterna magna is stable and consistent with an anatomic variant. There is no intracranial mass, hemorrhage, extra-axial fluid collection, or midline shift. The gray-white compartments appear normal. No acute infarct apparent. The bony calvarium appears intact. The mastoid air cells are clear.  IMPRESSION: No intracranial mass, hemorrhage, or gray- white compartment lesions/acute appearing infarct. Prominence of the cisterna magna is a stable anatomic variant.   Electronically Signed   By: Bretta BangWilliam  Woodruff III M.D.   On:  11/26/2014 15:12     EKG Interpretation None     Dr. Thad Rangereynolds was consult. She has evaluated the patient in the department and feels this is most likely due to seizure. Patient is to have Keppra increased to 1500 mg twice a day with inflation of evaluation as an outpatient. MDM   Final diagnoses:  Seizure   Patient presents as outlined above. On physical examination emergency department symptoms are resolved. He has a normal neurologic examination. Plan is as per radiology.    Arby BarretteMarcy Skarlet Lyons, MD 11/26/14 [phone]

## 2014-11-26 NOTE — Consult Note (Signed)
Reason for Consult:Confusion Referring Physician: Pfeiffer  CC: Confusion  HPI: Scott Fleming is an 59 y.o. male with a history of DVT/PE and seizure disorder who reports that today he was working on the computer. At about 1000 noted that he had difficulty getting his words out and was confused.  Patient could not remember anything that happened earlier in the day or anything that happened yesterday.  He finally was able to call his wife around 1130 but she reports that he was still not back to baseline.  When she arrived home at about 1215 he was at baseline.  The patient did note drool that was dried on the right front of his sweater.  He does not recall how that happened. The patient has a history of seizures.  Reports the he usually gets warning before a seizure occurs and feels his head start to turn to the right.  His wife reports that he then stiffens all over.  Last seizure was about 2 years ago when changing from Dilantin to Keppra.    Past Medical History  Diagnosis Date  . Seizures   . Pulmonary embolism   . [place] spotted fever     Past Surgical History  Procedure Laterality Date  . Skin cancer removed      Family history: Mother died with lupus.  Father alive with CAD.    Social History:  reports that he has never smoked. He does not have any smokeless tobacco history on file. He reports that he drinks about 1.8 oz of alcohol per week. He reports that he does not use illicit drugs.  Allergies  Allergen Reactions  . Penicillins     unknown    Medications: I have reviewed the patient's current medications. Prior to Admission:  Current outpatient prescriptions:  .  levETIRAcetam (KEPPRA) 500 MG tablet, Take 1 tablet (500 mg total) by mouth 2 (two) times daily. (Patient taking differently: Take 1,000 mg by mouth 2 (two) times daily. ), Disp: 60 tablet, Rfl: 0 .  doxycycline (VIBRA-TABS) 100 MG tablet, Take 1 tablet (100 mg total) by mouth every 12 (twelve) hours.  (Patient not taking: Reported on 11/26/2014), Disp: 16 tablet, Rfl: 0 .  nystatin (MYCOSTATIN) 100000 UNIT/ML suspension, Take 5 mLs (500,000 Units total) by mouth 4 (four) times daily. Continue for 7-10 or until symptoms resolve. (Patient not taking: Reported on 11/26/2014), Disp: 60 mL, Rfl: 0 .  Rivaroxaban (XARELTO) 20 MG TABS, Take 1 tablet (20 mg total) by mouth daily with supper. (Patient not taking: Reported on 11/26/2014), Disp: 30 tablet, Rfl: 0  ROS: History obtained from the patient  General ROS: negative for - chills, fatigue, fever, night sweats, weight gain or weight loss Psychological ROS: as noted in HPI Ophthalmic ROS: negative for - blurry vision, double vision, eye pain or loss of vision ENT ROS: negative for - epistaxis, nasal discharge, oral lesions, sore throat, tinnitus or vertigo Allergy and Immunology ROS: negative for - hives or itchy/watery eyes Hematological and Lymphatic ROS: negative for - bleeding problems, bruising or swollen lymph nodes Endocrine ROS: negative for - galactorrhea, hair pattern changes, polydipsia/polyuria or temperature intolerance Respiratory ROS: negative for - cough, hemoptysis, shortness of breath or wheezing Cardiovascular ROS: negative for - chest pain, dyspnea on exertion, edema or irregular heartbeat Gastrointestinal ROS: negative for - abdominal pain, diarrhea, hematemesis, nausea/vomiting or stool incontinence Genito-Urinary ROS: negative for - dysuria, hematuria, incontinence or urinary frequency/urgency Musculoskeletal ROS: negative for - joint swelling or muscular weakness  Neurological ROS: as noted in HPI Dermatological ROS: negative for rash and skin lesion changes  Physical Examination: Blood pressure 139/79, pulse 66, temperature 97.8 F (36.6 C), temperature source Oral, resp. rate 16, SpO2 99 %.  HEENT-  Normocephalic, no lesions, without obvious abnormality.  Normal external eye and conjunctiva.  Normal TM's bilaterally.   Normal auditory canals and external ears. Normal external nose, mucus membranes and septum.  Normal pharynx. Cardiovascular- S1, S2 normal, pulses palpable throughout   Lungs- chest clear, no wheezing, rales, normal symmetric air entry Abdomen- soft, non-tender; bowel sounds normal; no masses,  no organomegaly Extremities- no edema Lymph-no adenopathy palpable Musculoskeletal-no joint tenderness, deformity or swelling Skin-erthematous rash along trunk (front and back) and both upper extremities.  No noted on lower extremities.  Neurological Examination Mental Status: Alert, oriented, thought content appropriate.  Speech fluent without evidence of aphasia.  Able to follow 3 step commands without difficulty. Cranial Nerves: II: Discs flat bilaterally; Visual fields grossly normal, pupils equal, round, reactive to light and accommodation III,IV, VI: ptosis not present, extra-ocular motions intact bilaterally V,VII: smile symmetric, facial light touch sensation normal bilaterally VIII: hearing normal bilaterally IX,X: gag reflex present XI: bilateral shoulder shrug XII: midline tongue extension Motor: Right : Upper extremity   5/5    Left:     Upper extremity   5/5  Lower extremity   5/5     Lower extremity   5/5 Tone and bulk:normal tone throughout; no atrophy noted Sensory: Pinprick and light touch intact throughout, bilaterally Deep Tendon Reflexes: 2+ and symmetric throughout Plantars: Right: upgoing   Left: downgoing Cerebellar: normal finger-to-nose, normal rapid alternating movements and normal heel-to-shin test Gait: normal gait and station      Laboratory Studies:   Basic Metabolic Panel:  Recent Labs Lab 11/26/14 1426  NA 139  K 4.2  CL 104  GLUCOSE 118*  BUN 14  CREATININE 1.10    Liver Function Tests: No results for input(s): AST, ALT, ALKPHOS, BILITOT, PROT, ALBUMIN in the last 168 hours. No results for input(s): LIPASE, AMYLASE in the last 168 hours. No  results for input(s): AMMONIA in the last 168 hours.  CBC:  Recent Labs Lab 11/26/14 1415 11/26/14 1426  WBC 7.4  --   NEUTROABS 6.4  --   HGB 13.7 14.3  HCT 39.8 42.0  MCV 93.0  --   PLT 188  --     Cardiac Enzymes: No results for input(s): CKTOTAL, CKMB, CKMBINDEX, TROPONINI in the last 168 hours.  BNP: Invalid input(s): POCBNP  CBG: No results for input(s): GLUCAP in the last 168 hours.  Microbiology: Results for orders placed or performed during the hospital encounter of 02/01/13  Urine culture     Status: None   Collection Time: 02/01/13 12:19 PM  Result Value Ref Range Status   Specimen Description URINE, RANDOM  Final   Special Requests NONE  Final   Culture  Setup Time 02/01/2013 20:56  Final   Colony Count NO GROWTH  Final   Culture NO GROWTH  Final   Report Status 02/02/2013 FINAL  Final  Culture, blood (routine x 2)     Status: None   Collection Time: 02/01/13  2:35 PM  Result Value Ref Range Status   Specimen Description BLOOD LEFT ARM  Final   Special Requests BOTTLES DRAWN AEROBIC AND ANAEROBIC 10CC  Final   Culture  Setup Time 02/01/2013 22:15  Final   Culture NO GROWTH 5 DAYS  Final  Report Status 02/07/2013 FINAL  Final  Culture, blood (routine x 2)     Status: None   Collection Time: 02/01/13  2:50 PM  Result Value Ref Range Status   Specimen Description BLOOD RIGHT HAND  Final   Special Requests   Final    BOTTLES DRAWN AEROBIC AND ANAEROBIC 10CC BLUE, 5CC RED   Culture  Setup Time 02/01/2013 22:14  Final   Culture NO GROWTH 5 DAYS  Final   Report Status 02/07/2013 FINAL  Final    Coagulation Studies:  Recent Labs  11/26/14 1415  LABPROT 14.2  INR 1.09    Urinalysis: No results for input(s): COLORURINE, LABSPEC, PHURINE, GLUCOSEU, HGBUR, BILIRUBINUR, KETONESUR, PROTEINUR, UROBILINOGEN, NITRITE, LEUKOCYTESUR in the last 168 hours.  Invalid input(s): APPERANCEUR  Lipid Panel:  No results found for: CHOL, TRIG, HDL, CHOLHDL, VLDL,  LDLCALC  HgbA1C:  Lab Results  Component Value Date   HGBA1C 5.2 02/01/2013    Urine Drug Screen:  No results found for: LABOPIA, COCAINSCRNUR, LABBENZ, AMPHETMU, THCU, LABBARB  Alcohol Level: No results for input(s): ETH in the last 168 hours.  Other results: EKG: sinus rhythm at 76 bpm with occasional PVC's.  Imaging: Ct Head (brain) Wo Contrast  11/26/2014   CLINICAL DATA:  Confusion for 1 day  EXAM: CT HEAD WITHOUT CONTRAST  TECHNIQUE: Contiguous axial images were obtained from the base of the skull through the vertex without intravenous contrast.  COMPARISON:  April 08, 2013  FINDINGS: The ventricles are normal in size and configuration. Prominence of the cisterna magna is stable and consistent with an anatomic variant. There is no intracranial mass, hemorrhage, extra-axial fluid collection, or midline shift. The gray-white compartments appear normal. No acute infarct apparent. The bony calvarium appears intact. The mastoid air cells are clear.  IMPRESSION: No intracranial mass, hemorrhage, or gray- white compartment lesions/acute appearing infarct. Prominence of the cisterna magna is a stable anatomic variant.   Electronically Signed   By: Bretta BangWilliam  Woodruff III M.D.   On: 11/26/2014 15:12     Assessment/Plan: 59 year old male with a history seizures who presents with an episode that by description was likely a breakthrough seizure.  Patient has not had a seizure in about 2 years.  Head CT personally reviewed and shows no acute changes.  Patient also with a new rash.  Has pulled a couple of ticks off of him recently.    Recommendations: 1.  Lyme titer, RMSF titer 2.  Increase Keppra to 1500mg  BID 3.  Patient may have further imaging with an MRI of the brain with and without contrast on an outpatient basis. 4.  Patient unable to drive, operate heavy machinery, perform activities at heights and participate in water activities until release by outpatient physician.  Patient to follow  up with neurology on an outpatient basis.    Thana FarrLeslie Becker Christopher, MD [facility] [phone] 11/26/2014, 3:18 PM

## 2014-11-27 LAB — ROCKY MTN SPOTTED FVR ABS PNL(IGG+IGM)
RMSF IgG: NEGATIVE
RMSF IgM: 0.56 index (ref 0.00–0.89)

## 2014-11-27 LAB — B. BURGDORFI ANTIBODIES: B burgdorferi Ab IgG+IgM: <0.91 {ISR} (ref 0.00–0.90)

## 2016-11-27 DIAGNOSIS — R5381 Other malaise: Secondary | ICD-10-CM | POA: Diagnosis not present

## 2016-11-27 DIAGNOSIS — I10 Essential (primary) hypertension: Secondary | ICD-10-CM | POA: Diagnosis not present

## 2016-11-27 DIAGNOSIS — I2699 Other pulmonary embolism without acute cor pulmonale: Secondary | ICD-10-CM | POA: Diagnosis not present

## 2016-11-27 DIAGNOSIS — E784 Other hyperlipidemia: Secondary | ICD-10-CM | POA: Diagnosis not present

## 2018-04-23 DIAGNOSIS — R634 Abnormal weight loss: Secondary | ICD-10-CM | POA: Diagnosis not present

## 2018-04-23 DIAGNOSIS — I2699 Other pulmonary embolism without acute cor pulmonale: Secondary | ICD-10-CM | POA: Diagnosis not present

## 2018-04-26 ENCOUNTER — Other Ambulatory Visit: Payer: Self-pay

## 2018-04-26 DIAGNOSIS — Z1211 Encounter for screening for malignant neoplasm of colon: Secondary | ICD-10-CM

## 2018-05-17 ENCOUNTER — Telehealth: Payer: Self-pay

## 2018-05-17 NOTE — Telephone Encounter (Signed)
Patient contacted office to cancel his colonoscopy with Dr. Tobi Bastos on 05/21/18 due to family crisis.  He has to go out of town.  Colonoscopy has been canceled with Trish in Endoscopy.  Thanks [employer]

## 2018-05-21 ENCOUNTER — Ambulatory Visit: Admit: 2018-05-21 | Payer: 59 | Admitting: Gastroenterology

## 2018-05-21 SURGERY — COLONOSCOPY WITH PROPOFOL
Anesthesia: General

## 2018-12-12 DIAGNOSIS — R079 Chest pain, unspecified: Secondary | ICD-10-CM | POA: Diagnosis not present

## 2018-12-12 DIAGNOSIS — Z789 Other specified health status: Secondary | ICD-10-CM | POA: Diagnosis not present

## 2018-12-12 DIAGNOSIS — R0789 Other chest pain: Secondary | ICD-10-CM | POA: Diagnosis not present

## 2018-12-12 DIAGNOSIS — R5381 Other malaise: Secondary | ICD-10-CM | POA: Diagnosis not present

## 2018-12-12 DIAGNOSIS — Z125 Encounter for screening for malignant neoplasm of prostate: Secondary | ICD-10-CM | POA: Diagnosis not present

## 2018-12-12 DIAGNOSIS — I10 Essential (primary) hypertension: Secondary | ICD-10-CM | POA: Diagnosis not present

## 2018-12-17 DIAGNOSIS — R079 Chest pain, unspecified: Secondary | ICD-10-CM | POA: Diagnosis not present

## 2018-12-17 DIAGNOSIS — G40909 Epilepsy, unspecified, not intractable, without status epilepticus: Secondary | ICD-10-CM | POA: Diagnosis not present

## 2018-12-17 DIAGNOSIS — R0789 Other chest pain: Secondary | ICD-10-CM | POA: Diagnosis not present

## 2020-02-01 ENCOUNTER — Other Ambulatory Visit: Payer: Self-pay | Admitting: Internal Medicine

## 2020-04-30 ENCOUNTER — Other Ambulatory Visit: Payer: Self-pay | Admitting: Internal Medicine

## 2020-06-07 ENCOUNTER — Encounter: Payer: Self-pay | Admitting: Internal Medicine

## 2020-06-07 ENCOUNTER — Ambulatory Visit: Payer: 59 | Admitting: Internal Medicine

## 2020-06-07 ENCOUNTER — Other Ambulatory Visit: Payer: Self-pay

## 2020-06-07 VITALS — BP 140/80 | HR 82 | Ht 72.0 in | Wt 181.4 lb

## 2020-06-07 DIAGNOSIS — I10 Essential (primary) hypertension: Secondary | ICD-10-CM

## 2020-06-07 DIAGNOSIS — J301 Allergic rhinitis due to pollen: Secondary | ICD-10-CM | POA: Diagnosis not present

## 2020-06-07 DIAGNOSIS — G40909 Epilepsy, unspecified, not intractable, without status epilepticus: Secondary | ICD-10-CM | POA: Diagnosis not present

## 2020-06-07 DIAGNOSIS — M7712 Lateral epicondylitis, left elbow: Secondary | ICD-10-CM | POA: Diagnosis not present

## 2020-06-07 NOTE — Progress Notes (Signed)
Established Patient Office Visit  Subjective:  Patient ID: Scott Fleming, male    DOB: [birth year]  Age: 64 y.o. MRN: [id number]  CC:  Chief Complaint  Patient presents with  . Elbow Pain    patient having left elbow pain, has been a problem for a while but has worsened rescently. Patiet requesting steroid injection.     HPI  Scott Fleming presents for elbow pain  , no injury  Past Medical History:  Diagnosis Date  . Pulmonary embolism (HCC)   . [facility] spotted fever   . Seizures (HCC)     Past Surgical History:  Procedure Laterality Date  . skin cancer removed      History reviewed. No pertinent family history.  Social History   Socioeconomic History  . Marital status: Married    Spouse name: Not on file  . Number of children: Not on file  . Years of education: Not on file  . Highest education level: Not on file  Occupational History  . Not on file  Tobacco Use  . Smoking status: Never Smoker  . Smokeless tobacco: Never Used  Substance and Sexual Activity  . Alcohol use: Yes    Alcohol/week: 3.0 standard drinks    Types: 3 Cans of beer per week    Comment: occ  . Drug use: No  . Sexual activity: Yes  Other Topics Concern  . Not on file  Social History Narrative  . Not on file   Social Determinants of Health   Financial Resource Strain:   . Difficulty of Paying Living Expenses: Not on file  Food Insecurity:   . Worried About Programme researcher, broadcasting/film/video in the Last Year: Not on file  . Ran Out of Food in the Last Year: Not on file  Transportation Needs:   . Lack of Transportation (Medical): Not on file  . Lack of Transportation (Non-Medical): Not on file  Physical Activity:   . Days of Exercise per Week: Not on file  . Minutes of Exercise per Session: Not on file  Stress:   . Feeling of Stress : Not on file  Social Connections:   . Frequency of Communication with Friends and Family: Not on file  . Frequency of Social Gatherings with Friends and  Family: Not on file  . Attends Religious Services: Not on file  . Active Member of Clubs or Organizations: Not on file  . Attends Banker Meetings: Not on file  . Marital Status: Not on file  Intimate Partner Violence:   . Fear of Current or Ex-Partner: Not on file  . Emotionally Abused: Not on file  . Physically Abused: Not on file  . Sexually Abused: Not on file     Current Outpatient Medications:  .  aspirin 81 MG chewable tablet, Chew 1 tablet (81 mg total) by mouth daily., Disp: 30 tablet, Rfl: 0 .  doxycycline (VIBRA-TABS) 100 MG tablet, Take 1 tablet (100 mg total) by mouth every 12 (twelve) hours., Disp: 16 tablet, Rfl: 0 .  levETIRAcetam (KEPPRA) 500 MG tablet, TAKE 5 TABLETS BY MOUTH EVERY MORNING, Disp: 150 tablet, Rfl: 2 .  nystatin (MYCOSTATIN) 100000 UNIT/ML suspension, Take 5 mLs (500,000 Units total) by mouth 4 (four) times daily. Continue for 7-10 or until symptoms resolve., Disp: 60 mL, Rfl: 0 .  Rivaroxaban (XARELTO) 20 MG TABS, Take 1 tablet (20 mg total) by mouth daily with supper., Disp: 30 tablet, Rfl: 0   Allergies  Allergen Reactions  .  Penicillins     unknown    ROS Review of Systems  Constitutional: Negative.   HENT: Negative.   Eyes: Negative.   Respiratory: Negative.   Cardiovascular: Negative.   Gastrointestinal: Negative.   Endocrine: Negative.   Genitourinary: Negative.   Musculoskeletal: Negative.        Tender in the left elbow area.  Skin: Negative.   Allergic/Immunologic: Negative.   Neurological: Negative.   Hematological: Negative.   Psychiatric/Behavioral: Negative.   All other systems reviewed and are negative.     Objective:    Physical Exam Vitals reviewed.  Constitutional:      Appearance: Normal appearance.  HENT:     Mouth/Throat:     Mouth: Mucous membranes are moist.  Eyes:     Pupils: Pupils are equal, round, and reactive to light.  Neck:     Vascular: No carotid bruit.  Cardiovascular:     Rate  and Rhythm: Normal rate and regular rhythm.     Pulses: Normal pulses.     Heart sounds: Normal heart sounds.  Pulmonary:     Effort: Pulmonary effort is normal.     Breath sounds: Normal breath sounds.  Abdominal:     General: Bowel sounds are normal.     Palpations: Abdomen is soft. There is no hepatomegaly, splenomegaly or mass.     Tenderness: There is no abdominal tenderness.     Hernia: No hernia is present.  Musculoskeletal:     Cervical back: Neck supple.     Right lower leg: No edema.     Left lower leg: No edema.     Comments: Patient is tender at tennis elbow point.  On the left side.  Skin:    Findings: No rash.  Neurological:     Mental Status: He is alert and oriented to person, place, and time.     Motor: No weakness.  Psychiatric:        Mood and Affect: Mood normal.        Behavior: Behavior normal.     BP 140/80   Pulse 82   Ht 6' (1.829 m)   Wt 181 lb 6.4 oz (82.3 kg)   BMI 24.60 kg/m  Wt Readings from Last 3 Encounters:  06/07/20 181 lb 6.4 oz (82.3 kg)  02/03/13 195 lb 1.7 oz (88.5 kg)  12/09/12 192 lb (87.1 kg)     Health Maintenance Due  Topic Date Due  . Hepatitis C Screening  Never done  . COVID-19 Vaccine (1) Never done  . TETANUS/TDAP  Never done  . COLONOSCOPY  Never done  . INFLUENZA VACCINE  Never done    There are no preventive care reminders to display for this patient.  Lab Results  Component Value Date   TSH 0.971 02/01/2013   Lab Results  Component Value Date   WBC 7.4 11/26/2014   HGB 14.3 11/26/2014   HCT 42.0 11/26/2014   MCV 93.0 11/26/2014   PLT 188 11/26/2014   Lab Results  Component Value Date   NA 139 11/26/2014   K 4.2 11/26/2014   CO2 23 11/26/2014   GLUCOSE 118 (H) 11/26/2014   BUN 14 11/26/2014   CREATININE 1.10 11/26/2014   BILITOT 0.5 11/26/2014   ALKPHOS 36 (L) 11/26/2014   AST 24 11/26/2014   ALT 18 11/26/2014   PROT 7.3 11/26/2014   ALBUMIN 4.3 11/26/2014   CALCIUM 9.1 11/26/2014    ANIONGAP 9 11/26/2014   No results found for:  CHOL No results found for: HDL No results found for: LDLCALC No results found for: TRIG No results found for: CHOLHDL Lab Results  Component Value Date   HGBA1C 5.2 02/01/2013      Assessment & Plan:   Problem List Items Addressed This Visit      Cardiovascular and Mediastinum   Essential hypertension    -   - I encouraged the patient to eat a low-sodium diet to help control blood pressure. - I encouraged the patient to live an active lifestyle and complete activities that increases heart rate to 85% target heart rate at least 5 times per week for one hour.            Respiratory   Seasonal allergic rhinitis due to pollen    Patient was advised to take Claritin 5 mg p.o. daily        Nervous and Auditory   Seizure disorder [facility])    Seizure disorder is stable at the present time.  Patient is on Keppra.        Musculoskeletal and Integument   Lateral epicondylitis of left elbow - Primary     The patient was injected with 10 mg of Kenalog shot.lt epicondyle   Under 1% Xylocaine  Local aneasthesia Hand/Upper Extremity Injection/Arthrocentesis: L elbow for lateral epicondylitis on 06/13/2020 12:47 PM Indications: pain and therapeutic Details: 22 G needle, ulnar approach  Hand/Upper Extremity Injection: L elbow for lateral epicondylitis on 06/13/2020 12:47 PM Indications: pain and therapeutic Details: 22 G needle, ulnar approach Consent was given by the patient.  Left epicondyles was injected with 1 cc of lidocaine 1% followed by 10 mg of Kenalog in the left epicondyles area and close to the tendon.  Patient tolerated the procedure well.    Consent was given by the patient.     Follow-up: No follow-ups on file.    Corky Downs, MD

## 2020-06-13 ENCOUNTER — Encounter: Payer: Self-pay | Admitting: Internal Medicine

## 2020-06-13 NOTE — Assessment & Plan Note (Signed)
.  -   I encouraged the patient to eat a low-sodium diet to help control blood pressure. - I encouraged the patient to live an active lifestyle and complete activities that increases heart rate to 85% target heart rate at least 5 times per week for one hour.     

## 2020-06-13 NOTE — Assessment & Plan Note (Signed)
Patient was advised to take Claritin 5 mg p.o. daily ?

## 2020-06-13 NOTE — Assessment & Plan Note (Signed)
Seizure disorder is stable at the present time.  Patient is on Keppra.

## 2020-07-31 ENCOUNTER — Other Ambulatory Visit: Payer: Self-pay | Admitting: Internal Medicine

## 2020-09-16 ENCOUNTER — Ambulatory Visit (INDEPENDENT_AMBULATORY_CARE_PROVIDER_SITE_OTHER): Payer: BC Managed Care – PPO | Admitting: Family Medicine

## 2020-09-16 ENCOUNTER — Encounter: Payer: Self-pay | Admitting: Family Medicine

## 2020-09-16 ENCOUNTER — Other Ambulatory Visit: Payer: Self-pay

## 2020-09-16 VITALS — BP 155/85 | HR 71 | Ht 72.0 in | Wt 183.7 lb

## 2020-09-16 DIAGNOSIS — D696 Thrombocytopenia, unspecified: Secondary | ICD-10-CM | POA: Diagnosis not present

## 2020-09-16 DIAGNOSIS — I83892 Varicose veins of left lower extremities with other complications: Secondary | ICD-10-CM | POA: Insufficient documentation

## 2020-09-16 MED ORDER — CEPHALEXIN 500 MG PO CAPS: 500.0000 mg | ORAL_CAPSULE | Freq: Four times a day (QID) | ORAL | 0 refills | Status: AC

## 2020-09-16 NOTE — Assessment & Plan Note (Addendum)
Patient in today with upper left leg swelling and redness x 3 days. He has hx of DVT with PE. He has not been on any anticoagulants for years. He denies sob or CP. The swelling is on top of a large varicose vein.  Plan- [place] to rule out dvt scheduled for tomorrow, gave him Eliquis 5 mg bid samples from the office until we get the results.  also discussed the need for f/u with Vascular surgery for long term treatment.

## 2020-09-16 NOTE — Progress Notes (Signed)
Established Patient Office Visit  SUBJECTIVE:  Subjective  Patient ID: Scott Fleming, male    DOB: February 23, 1956  Age: 65 y.o. MRN: [id number]  CC:  Chief Complaint  Patient presents with  . left leg pain    Patient has a knot on left leg x 3 days, has redness, and feels warm to the touch. Patient wants to rule out blood clot and states that he has a history of blood clots.     HPI Scott Fleming is a 65 y.o. male presenting today for     Past Medical History:  Diagnosis Date  . Pulmonary embolism (HCC)   . [facility] spotted fever   . Seizures (HCC)     Past Surgical History:  Procedure Laterality Date  . skin cancer removed      History reviewed. No pertinent family history.  Social History   Socioeconomic History  . Marital status: Married    Spouse name: Not on file  . Number of children: Not on file  . Years of education: Not on file  . Highest education level: Not on file  Occupational History  . Not on file  Tobacco Use  . Smoking status: Never Smoker  . Smokeless tobacco: Never Used  Substance and Sexual Activity  . Alcohol use: Yes    Alcohol/week: 3.0 standard drinks    Types: 3 Cans of beer per week    Comment: occ  . Drug use: No  . Sexual activity: Yes  Other Topics Concern  . Not on file  Social History Narrative  . Not on file   Social Determinants of Health   Financial Resource Strain: Not on file  Food Insecurity: Not on file  Transportation Needs: Not on file  Physical Activity: Not on file  Stress: Not on file  Social Connections: Not on file  Intimate Partner Violence: Not on file     Current Outpatient Medications:  .  cephALEXin (KEFLEX) 500 MG capsule, Take 1 capsule (500 mg total) by mouth 4 (four) times daily for 10 days., Disp: 40 capsule, Rfl: 0 .  aspirin 81 MG chewable tablet, Chew 1 tablet (81 mg total) by mouth daily., Disp: 30 tablet, Rfl: 0 .  doxycycline (VIBRA-TABS) 100 MG tablet, Take 1 tablet (100 mg total)  by mouth every 12 (twelve) hours., Disp: 16 tablet, Rfl: 0 .  levETIRAcetam (KEPPRA) 500 MG tablet, TAKE 5 TABLETS BY MOUTH EVERY MORNING, Disp: 150 tablet, Rfl: 2 .  nystatin (MYCOSTATIN) 100000 UNIT/ML suspension, Take 5 mLs (500,000 Units total) by mouth 4 (four) times daily. Continue for 7-10 or until symptoms resolve., Disp: 60 mL, Rfl: 0 .  Rivaroxaban (XARELTO) 20 MG TABS, Take 1 tablet (20 mg total) by mouth daily with supper., Disp: 30 tablet, Rfl: 0   Allergies  Allergen Reactions  . Penicillins     unknown    ROS Review of Systems  Constitutional: Negative.   HENT: Negative.   Respiratory: Negative.   Cardiovascular: Positive for leg swelling.  Gastrointestinal: Negative.   Genitourinary: Negative.   Musculoskeletal: Negative.   Neurological: Negative.   Psychiatric/Behavioral: Negative.      OBJECTIVE:    Physical Exam Constitutional:      Appearance: Normal appearance.  HENT:     Right Ear: Tympanic membrane normal.     Left Ear: Tympanic membrane normal.     Mouth/Throat:     Mouth: Mucous membranes are moist.  Abdominal:     General: Abdomen is flat.  Musculoskeletal:        General: Normal range of motion.  Skin:    General: Skin is warm.  Neurological:     Mental Status: He is alert.  Psychiatric:        Mood and Affect: Mood normal.     BP (!) 155/85   Pulse 71   Ht 6' (1.829 m)   Wt 183 lb 11.2 oz (83.3 kg)   BMI 24.91 kg/m  Wt Readings from Last 3 Encounters:  09/16/20 183 lb 11.2 oz (83.3 kg)  06/07/20 181 lb 6.4 oz (82.3 kg)  02/03/13 195 lb 1.7 oz (88.5 kg)    Health Maintenance Due  Topic Date Due  . Hepatitis C Screening  Never done  . COVID-19 Vaccine (1) Never done  . TETANUS/TDAP  Never done  . COLONOSCOPY (Pts 45-17yrs Insurance coverage will need to be confirmed)  Never done  . INFLUENZA VACCINE  Never done    There are no preventive care reminders to display for this patient.  CBC Latest Ref Rng & Units 11/26/2014  11/26/2014 04/08/2013  WBC 4.0 - 10.5 K/uL - 7.4 4.7  Hemoglobin 13.0 - 17.0 g/dL 56.4 33.2 95.1  Hematocrit 39.0 - 52.0 % 42.0 39.8 39.4(L)  Platelets 150 - 400 K/uL - 188 211   CMP Latest Ref Rng & Units 11/26/2014 11/26/2014 04/08/2013  Glucose 70 - 99 mg/dL 884(Z) 660(Y) 301(S)  BUN 6 - 20 mg/dL 14 11 14   Creatinine 0.61 - 1.24 mg/dL 0.10 9.32  Sodium 135 - 145 mmol/L 139 138 137  Potassium 3.5 - 5.1 mmol/L 4.2 4.1 4.4  Chloride 101 - 111 mmol/L 104 106 108(H)  CO2 22 - 32 mmol/L - 23 22  Calcium 8.9 - 10.3 mg/dL - 9.1 8.9  Total Protein 6.5 - 8.1 g/dL - 7.3 -  Total Bilirubin 0.3 - 1.2 mg/dL - 0.5 -  Alkaline Phos 38 - 126 U/L - 36(L) -  AST 15 - 41 U/L - 24 -  ALT 17 - 63 U/L - 18 -    Lab Results  Component Value Date   TSH 0.971 02/01/2013   Lab Results  Component Value Date   ALBUMIN 4.3 11/26/2014   ANIONGAP 9 11/26/2014   No results found for: CHOL, HDL, LDLCALC, CHOLHDL No results found for: TRIG Lab Results  Component Value Date   HGBA1C 5.2 02/01/2013      ASSESSMENT & PLAN:   Problem List Items Addressed This Visit      Cardiovascular and Mediastinum   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 He has hx of DVT with PE. He has not been on any anticoagulants for years. He denies sob or CP. The swelling is on top of a large varicose vein.  Plan- 04/04/2013 to rule out dvt scheduled for tomorrow, gave him Eliquis 5 mg bid samples from the office until we get the results.  also discussed the need for f/u with Vascular surgery for long term treatment.       Relevant Orders   Ambulatory referral to Vascular Surgery     Other   Thrombocytopenia, unspecified (HCC) - Primary   Relevant Orders   US Venous Img Lower Unilateral Left   Ambulatory referral to Vascular Surgery      Meds ordered this encounter  Medications  . cephALEXin (KEFLEX) 500 MG capsule    Sig:  Take 1 capsule (500  mg total) by mouth 4 (four) times daily for 10 days.    Dispense:  40 capsule    Refill:  0      Follow-up: No follow-ups on file.    Irish Lack, FNP [facility] [address] [place], [place] [address]

## 2020-09-16 NOTE — Progress Notes (Signed)
am

## 2020-09-17 ENCOUNTER — Other Ambulatory Visit: Payer: Self-pay

## 2020-09-17 ENCOUNTER — Ambulatory Visit
Admission: RE | Admit: 2020-09-17 | Discharge: 2020-09-17 | Disposition: A | Discharge status: Home / Self Care | Payer: BC Managed Care – PPO | Source: Ambulatory Visit | Attending: Family Medicine | Admitting: Family Medicine

## 2020-09-17 DIAGNOSIS — D696 Thrombocytopenia, unspecified: Secondary | ICD-10-CM | POA: Diagnosis not present

## 2020-09-17 DIAGNOSIS — I808 Phlebitis and thrombophlebitis of other sites: Secondary | ICD-10-CM | POA: Diagnosis not present

## 2020-09-17 DIAGNOSIS — I8002 Phlebitis and thrombophlebitis of superficial vessels of left lower extremity: Secondary | ICD-10-CM | POA: Diagnosis not present

## 2020-09-17 DIAGNOSIS — M79662 Pain in left lower leg: Secondary | ICD-10-CM | POA: Diagnosis not present

## 2020-09-23 ENCOUNTER — Other Ambulatory Visit: Payer: Self-pay

## 2020-09-23 ENCOUNTER — Encounter: Payer: Self-pay | Admitting: Family Medicine

## 2020-09-23 ENCOUNTER — Ambulatory Visit (INDEPENDENT_AMBULATORY_CARE_PROVIDER_SITE_OTHER): Payer: BC Managed Care – PPO | Admitting: Family Medicine

## 2020-09-23 DIAGNOSIS — I8002 Phlebitis and thrombophlebitis of superficial vessels of left lower extremity: Secondary | ICD-10-CM | POA: Diagnosis not present

## 2020-09-23 NOTE — Progress Notes (Signed)
Established Patient Office Visit  SUBJECTIVE:  Subjective  Patient ID: Scott Fleming, male    DOB: [birth year]  Age: 65 y.o. MRN: [id number]  CC:  Chief Complaint  Patient presents with  . Follow-up    Patient here for 1 week fu on blood clot in left leg. Patient is still taking eliquis and has apt with vein and vascular on 3/14.    HPI Scott Fleming is a 65 y.o. male presenting today for     Past Medical History:  Diagnosis Date  . Pulmonary embolism (HCC)   . [facility] spotted fever   . Seizures (HCC)     Past Surgical History:  Procedure Laterality Date  . skin cancer removed      History reviewed. No pertinent family history.  Social History   Socioeconomic History  . Marital status: Married    Spouse name: Not on file  . Number of children: Not on file  . Years of education: Not on file  . Highest education level: Not on file  Occupational History  . Not on file  Tobacco Use  . Smoking status: Never Smoker  . Smokeless tobacco: Never Used  Substance and Sexual Activity  . Alcohol use: Yes    Alcohol/week: 3.0 standard drinks    Types: 3 Cans of beer per week    Comment: occ  . Drug use: No  . Sexual activity: Yes  Other Topics Concern  . Not on file  Social History Narrative  . Not on file   Social Determinants of Health   Financial Resource Strain: Not on file  Food Insecurity: Not on file  Transportation Needs: Not on file  Physical Activity: Not on file  Stress: Not on file  Social Connections: Not on file  Intimate Partner Violence: Not on file     Current Outpatient Medications:  .  aspirin 81 MG chewable tablet, Chew 1 tablet (81 mg total) by mouth daily., Disp: 30 tablet, Rfl: 0 .  cephALEXin (KEFLEX) 500 MG capsule, Take 1 capsule (500 mg total) by mouth 4 (four) times daily for 10 days., Disp: 40 capsule, Rfl: 0 .  doxycycline (VIBRA-TABS) 100 MG tablet, Take 1 tablet (100 mg total) by mouth every 12 (twelve) hours., Disp: 16  tablet, Rfl: 0 .  levETIRAcetam (KEPPRA) 500 MG tablet, TAKE 5 TABLETS BY MOUTH EVERY MORNING, Disp: 150 tablet, Rfl: 2 .  nystatin (MYCOSTATIN) 100000 UNIT/ML suspension, Take 5 mLs (500,000 Units total) by mouth 4 (four) times daily. Continue for 7-10 or until symptoms resolve., Disp: 60 mL, Rfl: 0   Allergies  Allergen Reactions  . Penicillins     unknown    ROS Review of Systems  Constitutional: Negative.   HENT: Negative.   Respiratory: Negative.   Cardiovascular: Negative.   Genitourinary: Negative.   Musculoskeletal: Negative.   Neurological: Negative.   Psychiatric/Behavioral: Negative.      OBJECTIVE:    Physical Exam Vitals and nursing note reviewed.  Constitutional:      Appearance: Normal appearance.  HENT:     Head: Normocephalic.  Cardiovascular:     Rate and Rhythm: Normal rate and regular rhythm.     Pulses: Normal pulses.  Pulmonary:     Effort: Pulmonary effort is normal.  Musculoskeletal:        General: Normal range of motion.  Skin:    General: Skin is warm.  Neurological:     General: No focal deficit present.     Mental  Status: He is alert.     There were no vitals taken for this visit. Wt Readings from Last 3 Encounters:  09/16/20 183 lb 11.2 oz (83.3 kg)  06/07/20 181 lb 6.4 oz (82.3 kg)  02/03/13 195 lb 1.7 oz (88.5 kg)    Health Maintenance Due  Topic Date Due  . Hepatitis C Screening  Never done  . COVID-19 Vaccine (1) Never done  . TETANUS/TDAP  Never done  . COLONOSCOPY (Pts 45-44yrs Insurance coverage will need to be confirmed)  Never done  . INFLUENZA VACCINE  Never done    There are no preventive care reminders to display for this patient.  CBC Latest Ref Rng & Units 11/26/2014 11/26/2014 04/08/2013  WBC 4.0 - 10.5 K/uL - 7.4 4.7  Hemoglobin 13.0 - 17.0 g/dL 31.5 40.0 86.7  Hematocrit 39.0 - 52.0 % 42.0 39.8 39.4(L)  Platelets 150 - 400 K/uL - 188 211   CMP Latest Ref Rng & Units 11/26/2014 11/26/2014 04/08/2013  Glucose 70  - 99 mg/dL 619(J) 093(O) 671(I)  BUN 6 - 20 mg/dL 14 11 14   Creatinine 0.61 - 1.24 mg/dL 4.58 0.99  Sodium 135 - 145 mmol/L 139 138 137  Potassium 3.5 - 5.1 mmol/L 4.2 4.1 4.4  Chloride 101 - 111 mmol/L 104 106 108(H)  CO2 22 - 32 mmol/L - 23 22  Calcium 8.9 - 10.3 mg/dL - 9.1 8.9  Total Protein 6.5 - 8.1 g/dL - 7.3 -  Total Bilirubin 0.3 - 1.2 mg/dL - 0.5 -  Alkaline Phos 38 - 126 U/L - 36(L) -  AST 15 - 41 U/L - 24 -  ALT 17 - 63 U/L - 18 -    Lab Results  Component Value Date   TSH 0.971 02/01/2013   Lab Results  Component Value Date   ALBUMIN 4.3 11/26/2014   ANIONGAP 9 11/26/2014   No results found for: CHOL, HDL, LDLCALC, CHOLHDL No results found for: TRIG Lab Results  Component Value Date   HGBA1C 5.2 02/01/2013      ASSESSMENT & PLAN:   Problem List Items Addressed This Visit      Cardiovascular and Mediastinum   Phlebitis and thombophlb of superfic vessels of l low extrem - Primary    Patient returning after 04/04/2013 of Extremity suspects blood clot superficial likely. [place] did show a large superficial venous clot with Phlebitis. Treatment with abx has been successful. He has an appt with Dr [place] in Vascular March 16th. He will remain on Eliquis until this appt.  Plan- FU with Dr March 18 as planned.          No orders of the defined types were placed in this encounter.     Follow-up: No follow-ups on file.    Wyn Quaker, FNP [facility] [address] [place], [place]

## 2020-09-23 NOTE — Assessment & Plan Note (Signed)
Patient returning after [place] of Extremity suspects blood clot superficial likely. [place] did show a large superficial venous clot with Phlebitis. Treatment with abx has been successful. He has an appt with Dr Wyn Quaker in Vascular March 16th. He will remain on Eliquis until this appt.  Plan- FU with Dr Wyn Quaker as planned.

## 2020-10-06 ENCOUNTER — Encounter (INDEPENDENT_AMBULATORY_CARE_PROVIDER_SITE_OTHER): Payer: Self-pay | Admitting: Nurse Practitioner

## 2020-10-06 ENCOUNTER — Other Ambulatory Visit: Payer: Self-pay

## 2020-10-06 ENCOUNTER — Ambulatory Visit (INDEPENDENT_AMBULATORY_CARE_PROVIDER_SITE_OTHER): Payer: BC Managed Care – PPO | Admitting: Nurse Practitioner

## 2020-10-06 VITALS — BP 145/87 | HR 68 | Resp 16 | Ht 72.0 in | Wt 178.8 lb

## 2020-10-06 DIAGNOSIS — I8002 Phlebitis and thrombophlebitis of superficial vessels of left lower extremity: Secondary | ICD-10-CM

## 2020-10-06 DIAGNOSIS — I10 Essential (primary) hypertension: Secondary | ICD-10-CM | POA: Diagnosis not present

## 2020-10-06 MED ORDER — APIXABAN 5 MG PO TABS: 5.0000 mg | ORAL_TABLET | Freq: Two times a day (BID) | ORAL | 3 refills | Status: DC

## 2020-10-13 ENCOUNTER — Encounter (INDEPENDENT_AMBULATORY_CARE_PROVIDER_SITE_OTHER): Payer: Self-pay | Admitting: Nurse Practitioner

## 2020-10-13 NOTE — Progress Notes (Signed)
Subjective:    Patient ID: Scott Fleming, male    DOB: 10-Feb-1956, 65 y.o.   MRN: [id number] Chief Complaint  Patient presents with  . New Patient (Initial Visit)    Ref Rosezetta Schlatter thrombocytopenia,vv of left extermity    tarig zimmers is a 65 year old male that presents today due to a superficial thrombophlebitis in his left lower extremity.  The patient notes that this superficial thrombophlebitis appears suddenly.  Prior to that the patient has a history of large superficial varicosities.  The patient notes that he has been traveling much more recently including by plane and by car.  The patient also notes that he wears medical grade 1 compression stockings on a regular basis.  He was started on Eliquis by his primary care physician.  Currently he is tolerating the Eliquis well.  He notes that since starting the Eliquis his pain is reduced.  DVT study done 09/17/2020 showed no evidence of deep vein thrombosis however evidence of superficial thrombophlebitis in the great saphenous vein extending from the proximal thigh to the knee.  The patient has also had a previous history of DVT as well as pulmonary embolism.   Review of Systems  Skin: Positive for color change.  Hematological: Bruises/bleeds easily.  All other systems reviewed and are negative.      Objective:   Physical Exam Vitals reviewed.  HENT:     Head: Normocephalic.  Cardiovascular:     Rate and Rhythm: Normal rate.     Pulses: Normal pulses.     Comments: Margin noticeable superficial varicosity in the patient's left inner thigh extending to the knee Pulmonary:     Effort: Pulmonary effort is normal.  Neurological:     Mental Status: He is alert and oriented to person, place, and time.  Psychiatric:        Mood and Affect: Mood normal.        Behavior: Behavior normal.        Thought Content: Thought content normal.        Judgment: Judgment normal.     BP (!) 145/87 (BP Location: Right Arm)   Pulse 68   Resp 16    Ht 6' (1.829 m)   Wt 178 lb 12.8 oz (81.1 kg)   BMI 24.25 kg/m   Past Medical History:  Diagnosis Date  . Pulmonary embolism (HCC)   . [facility] spotted fever   . Seizures (HCC)     Social History   Socioeconomic History  . Marital status: Married    Spouse name: Not on file  . Number of children: Not on file  . Years of education: Not on file  . Highest education level: Not on file  Occupational History  . Not on file  Tobacco Use  . Smoking status: Never Smoker  . Smokeless tobacco: Never Used  Substance and Sexual Activity  . Alcohol use: Yes    Alcohol/week: 3.0 standard drinks    Types: 3 Cans of beer per week    Comment: occ  . Drug use: No  . Sexual activity: Yes  Other Topics Concern  . Not on file  Social History Narrative  . Not on file   Social Determinants of Health   Financial Resource Strain: Not on file  Food Insecurity: Not on file  Transportation Needs: Not on file  Physical Activity: Not on file  Stress: Not on file  Social Connections: Not on file  Intimate Partner Violence: Not on file  Past Surgical History:  Procedure Laterality Date  . skin cancer removed      History reviewed. No pertinent family history.  Allergies  Allergen Reactions  . Penicillins     unknown    CBC Latest Ref Rng & Units 11/26/2014 11/26/2014 04/08/2013  WBC 4.0 - 10.5 K/uL - 7.4 4.7  Hemoglobin 13.0 - 17.0 g/dL 85.6 31.4 97.0  Hematocrit 39.0 - 52.0 % 42.0 39.8 39.4(L)  Platelets 150 - 400 K/uL - 188 211      CMP     Component Value Date/Time   NA 139 11/26/2014 1426   NA 137 04/08/2013 0935   K 4.2 11/26/2014 1426   K 4.4 04/08/2013 0935   CL 104 11/26/2014 1426   CL 108 (H) 04/08/2013 0935   CO2 23 11/26/2014 1415   CO2 22 04/08/2013 0935   GLUCOSE 118 (H) 11/26/2014 1426   GLUCOSE 141 (H) 04/08/2013 0935   BUN 14 11/26/2014 1426   BUN 14 04/08/2013 0935   CREATININE 1.10 11/26/2014 1426   CREATININE 1.21 04/08/2013 0935    CALCIUM 9.1 11/26/2014 1415   CALCIUM 8.9 04/08/2013 0935   PROT 7.3 11/26/2014 1415   ALBUMIN 4.3 11/26/2014 1415   AST 24 11/26/2014 1415   ALT 18 11/26/2014 1415   ALKPHOS 36 (L) 11/26/2014 1415   BILITOT 0.5 11/26/2014 1415   GFRNONAA >60 11/26/2014 1415   GFRNONAA >60 04/08/2013 0935   GFRAA >60 11/26/2014 1415   GFRAA >60 04/08/2013 0935     No results found.     Assessment & Plan:   1. Phlebitis and thombophlb of superfic vessels of l low extrem Due to the extensive nature of the patient's superficial thrombophlebitis, in addition to the patient's previous history of DVT we will plan on having the patient remain on anticoagulation for at least 3 months.  At that time we will have the patient return for a venous reflux study to evaluate for possible severe superficial venous reflux that may be a contributing cause to the patient's superficial thrombophlebitis.  In the interim the patient is advised to utilize Tylenol for pain control.  He is also advised to continue with wearing his medical grade 1 compression stockings especially if he is traveling for timeframes longer than 2 hours. - apixaban (ELIQUIS) 5 MG TABS tablet; Take 1 tablet (5 mg total) by mouth 2 (two) times daily.  Dispense: 60 tablet; Refill: 3  2. Essential hypertension Continue antihypertensive medications as already ordered, these medications have been reviewed and there are no changes at this time.    Current Outpatient Medications on File Prior to Visit  Medication Sig Dispense Refill  . aspirin 81 MG chewable tablet Chew 1 tablet (81 mg total) by mouth daily. 30 tablet 0  . levETIRAcetam (KEPPRA) 500 MG tablet TAKE 5 TABLETS BY MOUTH EVERY MORNING 150 tablet 2  . doxycycline (VIBRA-TABS) 100 MG tablet Take 1 tablet (100 mg total) by mouth every 12 (twelve) hours. (Patient not taking: Reported on 10/06/2020) 16 tablet 0  . nystatin (MYCOSTATIN) 100000 UNIT/ML suspension Take 5 mLs (500,000 Units total) by  mouth 4 (four) times daily. Continue for 7-10 or until symptoms resolve. (Patient not taking: Reported on 10/06/2020) 60 mL 0   No current facility-administered medications on file prior to visit.    There are no Patient Instructions on file for this visit. No follow-ups on file.   Georgiana Spinner, NP

## 2020-10-30 ENCOUNTER — Other Ambulatory Visit: Payer: Self-pay | Admitting: Internal Medicine

## 2021-01-06 ENCOUNTER — Encounter (INDEPENDENT_AMBULATORY_CARE_PROVIDER_SITE_OTHER): Payer: BC Managed Care – PPO

## 2021-01-06 ENCOUNTER — Ambulatory Visit (INDEPENDENT_AMBULATORY_CARE_PROVIDER_SITE_OTHER): Payer: BC Managed Care – PPO | Admitting: Vascular Surgery

## 2021-01-25 ENCOUNTER — Other Ambulatory Visit: Payer: Self-pay | Admitting: Family Medicine

## 2021-04-24 ENCOUNTER — Other Ambulatory Visit: Payer: Self-pay | Admitting: Internal Medicine

## 2021-05-05 ENCOUNTER — Other Ambulatory Visit (INDEPENDENT_AMBULATORY_CARE_PROVIDER_SITE_OTHER): Payer: Self-pay | Admitting: Nurse Practitioner

## 2021-05-05 DIAGNOSIS — I8002 Phlebitis and thrombophlebitis of superficial vessels of left lower extremity: Secondary | ICD-10-CM

## 2021-07-25 ENCOUNTER — Other Ambulatory Visit: Payer: Self-pay | Admitting: Internal Medicine

## 2021-10-23 ENCOUNTER — Other Ambulatory Visit: Payer: Self-pay | Admitting: Internal Medicine

## 2021-10-24 ENCOUNTER — Other Ambulatory Visit: Payer: Self-pay | Admitting: *Deleted

## 2021-10-24 MED ORDER — LEVETIRACETAM 500 MG PO TABS: ORAL_TABLET | ORAL | 0 refills | Status: DC

## 2021-10-31 ENCOUNTER — Encounter: Payer: BC Managed Care – PPO | Admitting: Internal Medicine

## 2021-10-31 ENCOUNTER — Encounter: Payer: Self-pay | Admitting: Internal Medicine

## 2021-10-31 NOTE — Progress Notes (Signed)
Established Patient Office Visit  Subjective:  Patient ID: Scott Fleming, male    DOB: 1956/03/12  Age: 66 y.o. MRN: [id number]  CC:  Chief Complaint  Patient presents with  . Error    HPI  [name] presents for general chck  Past Medical History:  Diagnosis Date  . Pulmonary embolism (HCC)   . [facility] spotted fever   . Seizures (HCC)     Past Surgical History:  Procedure Laterality Date  . skin cancer removed      History reviewed. No pertinent family history.  Social History   Socioeconomic History  . Marital status: Married    Spouse name: Not on file  . Number of children: Not on file  . Years of education: Not on file  . Highest education level: Not on file  Occupational History  . Not on file  Tobacco Use  . Smoking status: Never  . Smokeless tobacco: Never  Substance and Sexual Activity  . Alcohol use: Yes    Alcohol/week: 3.0 standard drinks of alcohol    Types: 3 Cans of beer per week    Comment: occ  . Drug use: No  . Sexual activity: Yes  Other Topics Concern  . Not on file  Social History Narrative  . Not on file   Social Determinants of Health   Financial Resource Strain: Not on file  Food Insecurity: Not on file  Transportation Needs: Not on file  Physical Activity: Not on file  Stress: Not on file  Social Connections: Not on file  Intimate Partner Violence: Not on file     Current Outpatient Medications:  .  apixaban (ELIQUIS) 5 MG TABS tablet, Take 1 tablet (5 mg total) by mouth 2 (two) times daily., Disp: 60 tablet, Rfl: 3 .  levETIRAcetam (KEPPRA) 500 MG tablet, TAKE 5 TABLETS BY MOUTH EVERY MORNING, Disp: 450 tablet, Rfl: 6   Allergies  Allergen Reactions  . Penicillins     unknown    ROS Review of Systems    Objective:    Physical Exam  There were no vitals taken for this visit. Wt Readings from Last 3 Encounters:  03/30/22 176 lb 4.8 oz (80 kg)  10/06/20 178 lb 12.8 oz (81.1 kg)  09/16/20 183 lb  11.2 oz (83.3 kg)     Health Maintenance Due  Topic Date Due  . COVID-19 Vaccine (1) Never done  . Hepatitis C Screening  Never done  . DTaP/Tdap/Td (1 - Tdap) Never done  . COLONOSCOPY (Pts 45-65yrs Insurance coverage will need to be confirmed)  Never done  . Zoster Vaccines- Shingrix (1 of 2) Never done    There are no preventive care reminders to display for this patient.  Lab Results  Component Value Date   TSH 0.971 02/01/2013   Lab Results  Component Value Date   WBC 7.4 11/26/2014   HGB 14.3 11/26/2014   HCT 42.0 11/26/2014   MCV 93.0 11/26/2014   PLT 188 11/26/2014   Lab Results  Component Value Date   NA 139 11/26/2014   K 4.2 11/26/2014   CO2 23 11/26/2014   GLUCOSE 118 (H) 11/26/2014   BUN 14 11/26/2014   CREATININE 1.10 11/26/2014   BILITOT 0.5 11/26/2014   ALKPHOS 36 (L) 11/26/2014   AST 24 11/26/2014   ALT 18 11/26/2014   PROT 7.3 11/26/2014   ALBUMIN 4.3 11/26/2014   CALCIUM 9.1 11/26/2014   ANIONGAP 9 11/26/2014   No results found  for: "CHOL" No results found for: "HDL" No results found for: "LDLCALC" No results found for: "TRIG" No results found for: "CHOLHDL" Lab Results  Component Value Date   HGBA1C 5.2 02/01/2013      Assessment & Plan:   Problem List Items Addressed This Visit   None Visit Diagnoses    Erroneous encounter - disregard    -  Primary       No orders of the defined types were placed in this encounter.   Follow-up: No follow-ups on file.    Corky Downs, MD This encounter was created in error - please disregard.

## 2022-01-19 ENCOUNTER — Other Ambulatory Visit: Payer: Self-pay | Admitting: Internal Medicine

## 2022-01-20 ENCOUNTER — Ambulatory Visit: Payer: BC Managed Care – PPO | Admitting: Nurse Practitioner

## 2022-03-17 ENCOUNTER — Other Ambulatory Visit: Payer: Self-pay | Admitting: Internal Medicine

## 2022-03-23 ENCOUNTER — Ambulatory Visit: Payer: BC Managed Care – PPO | Admitting: Nurse Practitioner

## 2022-03-30 ENCOUNTER — Other Ambulatory Visit: Payer: Self-pay | Admitting: Nurse Practitioner

## 2022-03-30 ENCOUNTER — Encounter: Payer: Self-pay | Admitting: Nurse Practitioner

## 2022-03-30 ENCOUNTER — Ambulatory Visit: Payer: BC Managed Care – PPO | Admitting: Nurse Practitioner

## 2022-03-30 VITALS — BP 114/73 | HR 80 | Ht 72.0 in | Wt 176.3 lb

## 2022-03-30 DIAGNOSIS — G40909 Epilepsy, unspecified, not intractable, without status epilepticus: Secondary | ICD-10-CM

## 2022-03-30 DIAGNOSIS — I8002 Phlebitis and thrombophlebitis of superficial vessels of left lower extremity: Secondary | ICD-10-CM | POA: Diagnosis not present

## 2022-03-30 DIAGNOSIS — Z1211 Encounter for screening for malignant neoplasm of colon: Secondary | ICD-10-CM | POA: Diagnosis not present

## 2022-03-30 DIAGNOSIS — I10 Essential (primary) hypertension: Secondary | ICD-10-CM

## 2022-03-30 MED ORDER — APIXABAN 5 MG PO TABS: 5.0000 mg | ORAL_TABLET | Freq: Two times a day (BID) | ORAL | 3 refills | Status: DC

## 2022-03-30 MED ORDER — LEVETIRACETAM 500 MG PO TABS: ORAL_TABLET | ORAL | 1 refills | Status: DC

## 2022-03-30 NOTE — Progress Notes (Signed)
Established Patient Office Visit  Subjective:  Patient ID: Scott Fleming, male    DOB: Jun 25, 1956  Age: 66 y.o. MRN: [id number]  CC:  Chief Complaint  Patient presents with   Medication Refill     HPI  [name] presents for medication refill. He is doing well and no complaints at present. He is seizure free from last 8 years and is taking kepra 500 mg 5 tabs daily.   HPI   Past Medical History:  Diagnosis Date   Pulmonary embolism (HCC)    Rocky [place] spotted fever    Seizures (HCC)     Past Surgical History:  Procedure Laterality Date   skin cancer removed      History reviewed. No pertinent family history.  Social History   Socioeconomic History   Marital status: Married    Spouse name: Not on file   Number of children: Not on file   Years of education: Not on file   Highest education level: Not on file  Occupational History   Not on file  Tobacco Use   Smoking status: Never   Smokeless tobacco: Never  Substance and Sexual Activity   Alcohol use: Yes    Alcohol/week: 3.0 standard drinks of alcohol    Types: 3 Cans of beer per week    Comment: occ   Drug use: No   Sexual activity: Yes  Other Topics Concern   Not on file  Social History Narrative   Not on file   Social Determinants of Health   Financial Resource Strain: Not on file  Food Insecurity: Not on file  Transportation Needs: Not on file  Physical Activity: Not on file  Stress: Not on file  Social Connections: Not on file  Intimate Partner Violence: Not on file     Outpatient Medications Prior to Visit  Medication Sig Dispense Refill   levETIRAcetam (KEPPRA) 500 MG tablet TAKE 5 TABLETS BY MOUTH EVERY MORNING 150 tablet 1   apixaban (ELIQUIS) 5 MG TABS tablet Take 1 tablet (5 mg total) by mouth 2 (two) times daily. 60 tablet 3   aspirin 81 MG chewable tablet Chew 1 tablet (81 mg total) by mouth daily. 30 tablet 0   doxycycline (VIBRA-TABS) 100 MG tablet Take 1 tablet (100 mg  total) by mouth every 12 (twelve) hours. 16 tablet 0   nystatin (MYCOSTATIN) 100000 UNIT/ML suspension Take 5 mLs (500,000 Units total) by mouth 4 (four) times daily. Continue for 7-10 or until symptoms resolve. 60 mL 0   No facility-administered medications prior to visit.    Allergies  Allergen Reactions   Penicillins     unknown    ROS Review of Systems  Constitutional: Negative.   HENT: Negative.    Eyes: Negative.   Respiratory:  Negative for chest tightness and shortness of breath.   Cardiovascular:  Negative for chest pain and palpitations.  Gastrointestinal: Negative.   Genitourinary: Negative.   Musculoskeletal: Negative.   Neurological: Negative.   Psychiatric/Behavioral: Negative.        Objective:    Physical Exam Constitutional:      Appearance: Normal appearance. He is normal weight.  HENT:     Head: Normocephalic and atraumatic.     Right Ear: Tympanic membrane normal.     Nose: Nose normal.  Eyes:     Conjunctiva/sclera: Conjunctivae normal.     Pupils: Pupils are equal, round, and reactive to light.  Cardiovascular:     Rate and Rhythm: Normal rate  and regular rhythm.     Pulses: Normal pulses.     Heart sounds: Normal heart sounds.  Pulmonary:     Effort: Pulmonary effort is normal.     Breath sounds: Normal breath sounds.  Abdominal:     General: Abdomen is flat. Bowel sounds are normal.     Palpations: Abdomen is soft. There is no mass.     Tenderness: There is no abdominal tenderness.  Musculoskeletal:        General: Normal range of motion.  Skin:    General: Skin is warm.  Neurological:     General: No focal deficit present.     Mental Status: He is alert and oriented to person, place, and time. Mental status is at baseline.  Psychiatric:        Mood and Affect: Mood normal.        Behavior: Behavior normal.        Thought Content: Thought content normal.        Judgment: Judgment normal.     BP 114/73   Pulse 80   Ht 6' (1.829  m)   Wt 176 lb 4.8 oz (80 kg)   BMI 23.91 kg/m  Wt Readings from Last 3 Encounters:  03/30/22 176 lb 4.8 oz (80 kg)  10/06/20 178 lb 12.8 oz (81.1 kg)  09/16/20 183 lb 11.2 oz (83.3 kg)     Health Maintenance  Topic Date Due   Hepatitis C Screening  Never done   TETANUS/TDAP  Never done   COLONOSCOPY (Pts 45-63yrs Insurance coverage will need to be confirmed)  Never done   Zoster Vaccines- Shingrix (1 of 2) Never done   COVID-19 Vaccine (1) 04/15/2022 (Originally 09/25/1956)   INFLUENZA VACCINE  10/22/2022 (Originally 02/21/2022)   HIV Screening  Completed   HPV VACCINES  Aged Out    There are no preventive care reminders to display for this patient.  Lab Results  Component Value Date   TSH 0.971 02/01/2013   Lab Results  Component Value Date   WBC 7.4 11/26/2014   HGB 14.3 11/26/2014   HCT 42.0 11/26/2014   MCV 93.0 11/26/2014   PLT 188 11/26/2014   Lab Results  Component Value Date   NA 139 11/26/2014   K 4.2 11/26/2014   CO2 23 11/26/2014   GLUCOSE 118 (H) 11/26/2014   BUN 14 11/26/2014   CREATININE 1.10 11/26/2014   BILITOT 0.5 11/26/2014   ALKPHOS 36 (L) 11/26/2014   AST 24 11/26/2014   ALT 18 11/26/2014   PROT 7.3 11/26/2014   ALBUMIN 4.3 11/26/2014   CALCIUM 9.1 11/26/2014   ANIONGAP 9 11/26/2014   No results found for: "CHOL" No results found for: "HDL" No results found for: "LDLCALC" No results found for: "TRIG" No results found for: "CHOLHDL" Lab Results  Component Value Date   HGBA1C 5.2 02/01/2013      Assessment & Plan:   Problem List Items Addressed This Visit       Cardiovascular and Mediastinum   Essential hypertension    Stable. Blood pressure maintained by  lifestyle intervention.      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 Continue Eliquis 5 mg.      Relevant Medications   apixaban (ELIQUIS) 5 MG TABS  tablet     Nervous and Auditory   Seizure disorder (HCC)  Stable on medication. Would refer him to the neurologist.      Relevant Medications   levETIRAcetam (KEPPRA) 500 MG tablet   Other Relevant Orders   Ambulatory referral to Neurology   Other Visit Diagnoses     Screening for colon cancer       Relevant Orders   Cologuard        Meds ordered this encounter  Medications   levETIRAcetam (KEPPRA) 500 MG tablet    Sig: 5 tabs every morning    Dispense:  150 tablet    Refill:  1   apixaban (ELIQUIS) 5 MG TABS tablet    Sig: Take 1 tablet (5 mg total) by mouth 2 (two) times daily.    Dispense:  60 tablet    Refill:  3     Follow-up: Return in about 3 months (around 06/29/2022).    Kara Dies, NP

## 2022-04-06 NOTE — Assessment & Plan Note (Signed)
Stable. Blood pressure maintained by  lifestyle intervention.

## 2022-04-06 NOTE — Assessment & Plan Note (Signed)
Stable on medication. Would refer him to the neurologist.

## 2022-04-06 NOTE — Assessment & Plan Note (Signed)
Stable at present. Continue Eliquis 5 mg.

## 2022-04-26 ENCOUNTER — Other Ambulatory Visit: Payer: Self-pay | Admitting: Nurse Practitioner

## 2022-07-30 ENCOUNTER — Other Ambulatory Visit: Payer: Self-pay | Admitting: Nurse Practitioner

## 2022-07-30 DIAGNOSIS — I8002 Phlebitis and thrombophlebitis of superficial vessels of left lower extremity: Secondary | ICD-10-CM

## 2022-08-04 ENCOUNTER — Ambulatory Visit: Payer: BC Managed Care – PPO | Admitting: Nurse Practitioner

## 2023-07-20 ENCOUNTER — Encounter: Payer: Self-pay | Admitting: Family Medicine

## 2023-07-20 ENCOUNTER — Ambulatory Visit (INDEPENDENT_AMBULATORY_CARE_PROVIDER_SITE_OTHER): Payer: PPO | Admitting: Family Medicine

## 2023-07-20 VITALS — BP 138/83 | HR 65 | Temp 97.5°F | Resp 18 | Ht 72.0 in | Wt 181.0 lb

## 2023-07-20 DIAGNOSIS — I8002 Phlebitis and thrombophlebitis of superficial vessels of left lower extremity: Secondary | ICD-10-CM

## 2023-07-20 DIAGNOSIS — Z7689 Persons encountering health services in other specified circumstances: Secondary | ICD-10-CM

## 2023-07-20 DIAGNOSIS — G40909 Epilepsy, unspecified, not intractable, without status epilepticus: Secondary | ICD-10-CM | POA: Diagnosis not present

## 2023-07-20 DIAGNOSIS — I10 Essential (primary) hypertension: Secondary | ICD-10-CM

## 2023-07-20 DIAGNOSIS — Z1211 Encounter for screening for malignant neoplasm of colon: Secondary | ICD-10-CM

## 2023-07-20 DIAGNOSIS — R351 Nocturia: Secondary | ICD-10-CM

## 2023-07-20 DIAGNOSIS — Z79899 Other long term (current) drug therapy: Secondary | ICD-10-CM

## 2023-07-20 DIAGNOSIS — Z86711 Personal history of pulmonary embolism: Secondary | ICD-10-CM

## 2023-07-20 DIAGNOSIS — Z86718 Personal history of other venous thrombosis and embolism: Secondary | ICD-10-CM

## 2023-07-20 DIAGNOSIS — N401 Enlarged prostate with lower urinary tract symptoms: Secondary | ICD-10-CM

## 2023-07-20 DIAGNOSIS — Z1159 Encounter for screening for other viral diseases: Secondary | ICD-10-CM

## 2023-07-20 DIAGNOSIS — Z125 Encounter for screening for malignant neoplasm of prostate: Secondary | ICD-10-CM

## 2023-07-20 DIAGNOSIS — R7401 Elevation of levels of liver transaminase levels: Secondary | ICD-10-CM

## 2023-07-20 DIAGNOSIS — D696 Thrombocytopenia, unspecified: Secondary | ICD-10-CM

## 2023-07-20 MED ORDER — APIXABAN 5 MG PO TABS: 5.0000 mg | ORAL_TABLET | Freq: Two times a day (BID) | ORAL | 5 refills | Status: DC

## 2023-07-20 MED ORDER — LEVETIRACETAM 500 MG PO TABS: ORAL_TABLET | ORAL | 3 refills | Status: DC

## 2023-07-20 MED ORDER — TAMSULOSIN HCL 0.4 MG PO CAPS: 0.4000 mg | ORAL_CAPSULE | Freq: Every day | ORAL | 3 refills | Status: DC

## 2023-07-20 NOTE — Patient Instructions (Addendum)
[facility] [address], [place], [place] [address] Phone: [phone]   Recommend: Tetanus vaccine (Tdap) at the pharmacy     Pneumonia vaccine (Prevnar-20 or Prevnar-21)    Shingles vaccine (Shingrix)  ______________________________________________________ Check your blood pressure once daily, and any time you have concerning symptoms like headache, chest pain, dizziness, shortness of breath, or vision changes.   Our goal is less than 140/90.  To appropriately check your blood pressure, make sure you do the following:  1) Avoid caffeine, exercise, or tobacco products for 30 minutes before checking. Empty your bladder. 2) Sit with your back supported in a flat-backed chair. Rest your arm on something flat (arm of the chair, table, etc). 3) Sit still with your feet flat on the floor, resting, for at least 5 minutes.  4) Check your blood pressure. Take 1-2 readings.  5) Write down these readings and bring with you to any provider appointments.  Bring your home blood pressure machine with you to a provider's office for accuracy comparison at least once a year.   Make sure you take your blood pressure medications before you come to any office visit, even if you were asked to fast for labs.

## 2023-07-20 NOTE — Progress Notes (Unsigned)
New patient visit   Patient: Scott Fleming   DOB: September 26, 1955   67 y.o. Male  MRN: [id number] Visit Date: 07/20/2023  Today's healthcare provider: Sherlyn Hay, DO   Chief Complaint  Patient presents with   Establish Care   Subjective    Scott Fleming is a 67 y.o. male who presents today as a new patient to establish care.  HPI  The patient, a 67 year old individual with a history of deep vein thrombosis (DVT), pulmonary embolism (PE), and seizure disorder, presents for medication refills. He has been seizure-free for the past eight years while on Keppra (levetiracetam) 500mg , five tablets daily. He also takes Eliquis (apixaban) 5mg  twice daily for his history of DVT and PE. 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 He has noticed increased leg tenderness and varicose veins since reducing the Eliquis dosage.  The patient has not had any recent follow-ups with vascular surgery or neurology. He had previously considered weaning off Keppra under the guidance of a neurologist, but after a seizure during a previous attempt, he decided against it. The patient's last blood work was done sometime in the previous year. He has no known history of diabetes, elevated blood sugar, or high cholesterol. He reports a frequently runny nose but does not take any medications for allergies.   Past Medical History:  Diagnosis Date   Pulmonary embolism (HCC)    Rocky Mountain spotted fever    Seizures (HCC)    Past Surgical History:  Procedure Laterality Date   skin cancer removed     Family Status  Relation Name Status   Mother  Deceased   Father  Deceased   Sister  Alive   Brother  Alive  No partnership data on file   Family History  Problem Relation Age of Onset   Lupus Mother    Social History   Socioeconomic History   Marital status: Married    Spouse name: Not on file   Number of children: Not on file    Years of education: Not on file   Highest education level: Not on file  Occupational History   Not on file  Tobacco Use   Smoking status: Never   Smokeless tobacco: Never  Vaping Use   Vaping status: Never Used  Substance and Sexual Activity   Alcohol use: Yes    Alcohol/week: 3.0 standard drinks of alcohol    Types: 3 Cans of beer per week    Comment: occ   Drug use: No   Sexual activity: Yes  Other Topics Concern   Not on file  Social History Narrative   Not on file   Social Drivers of Health   Financial Resource Strain: Not on file  Food Insecurity: Not on file  Transportation Needs: Not on file  Physical Activity: Not on file  Stress: Not on file  Social Connections: Not on file   Outpatient Medications Prior to Visit  Medication Sig   [DISCONTINUED] ELIQUIS 5 MG TABS tablet TAKE 1 TABLET(5 MG) BY MOUTH TWICE DAILY   [DISCONTINUED] levETIRAcetam (KEPPRA) 500 MG tablet TAKE 5 TABLETS BY MOUTH EVERY MORNING   No facility-administered medications prior to visit.   Allergies  Allergen Reactions   Penicillins     unknown     There is no immunization history on file for this patient.  Health Maintenance  Topic Date Due   Medicare Annual Wellness (AWV)  Never done   DTaP/Tdap/Td (1 - Tdap) Never done   Zoster Vaccines- Shingrix (1 of 2) Never done   Colonoscopy  Never done   Pneumonia Vaccine 39+ Years old (1 of 1 - PCV) Never done   INFLUENZA VACCINE  10/22/2023 (Originally 02/22/2023)   COVID-19 Vaccine (1) 03/24/2024 (Originally 03/28/1961)   Hepatitis C Screening  Completed   HIV Screening  Completed   HPV VACCINES  Aged Out    Patient Care Team: Samanth Mirkin, Monico Blitz, DO as PCP - General (Family Medicine)  Review of Systems  Constitutional:  Negative for appetite change, chills, fatigue and fever.  HENT:  Negative for congestion, ear pain, hearing loss, nosebleeds and trouble swallowing.   Eyes:  Negative for pain and visual disturbance.  Respiratory:   Negative for cough, chest tightness and shortness of breath.   Cardiovascular:  Negative for chest pain, palpitations and leg swelling.  Gastrointestinal:  Negative for abdominal pain, blood in stool, constipation, diarrhea, nausea and vomiting.  Endocrine: Negative for polydipsia, polyphagia and polyuria.  Genitourinary:  Positive for difficulty urinating. Negative for dysuria and flank pain.       +dribbling and nocturia  Musculoskeletal:  Negative for arthralgias, back pain, joint swelling, myalgias and neck stiffness.  Skin:  Negative for color change, rash and wound.  Neurological:  Positive for numbness (intermittent in left toes). Negative for dizziness, tremors, seizures (not for past 9 years), speech difficulty, weakness, light-headedness and headaches.  Psychiatric/Behavioral:  Negative for behavioral problems, confusion, decreased concentration, dysphoric mood and sleep disturbance. The patient is not nervous/anxious.   All other systems reviewed and are negative.       Objective    BP 138/83   Pulse 65   Temp (!) 97.5 F (36.4 C)   Resp 18   Ht 6' (1.829 m)   Wt 181 lb (82.1 kg)   SpO2 96%   BMI 24.55 kg/m     Physical Exam Vitals and nursing note reviewed.  Constitutional:      General: He is awake.     Appearance: Normal appearance.  HENT:     Head: Normocephalic and atraumatic.     Right Ear: Tympanic membrane, ear canal and external ear normal.     Left Ear: Tympanic membrane, ear canal and external ear normal.     Nose: Nose normal.     Mouth/Throat:     Mouth: Mucous membranes are moist.     Pharynx: Oropharynx is clear. No oropharyngeal exudate or posterior oropharyngeal erythema.  Eyes:     General: No scleral icterus.    Extraocular Movements: Extraocular movements intact.     Conjunctiva/sclera: Conjunctivae normal.     Pupils: Pupils are equal, round, and reactive to light.  Neck:     Thyroid: No thyromegaly or thyroid tenderness.   Cardiovascular:     Rate and Rhythm: Normal rate and regular rhythm.     Pulses: Normal pulses.     Heart sounds: Normal heart sounds.  Pulmonary:     Effort: Pulmonary effort is normal. No tachypnea, bradypnea or respiratory distress.     Breath sounds: Normal breath sounds. No stridor. No wheezing, rhonchi or rales.  Abdominal:     General: Bowel sounds are normal. There is no distension.     Palpations: Abdomen is soft. There is no mass.     Tenderness: There is no abdominal tenderness. There is no guarding.     Hernia: No hernia is present.  Musculoskeletal:  Cervical back: Normal range of motion and neck supple.     Right lower leg: No edema.     Left lower leg: No edema.  Lymphadenopathy:     Cervical: No cervical adenopathy.  Skin:    General: Skin is warm and dry.     Findings: Ecchymosis (scattered on upper extremities) present.  Neurological:     Mental Status: He is alert and oriented to person, place, and time. Mental status is at baseline.  Psychiatric:        Mood and Affect: Mood normal.        Behavior: Behavior normal.     Depression Screen    07/20/2023    1:41 PM 03/30/2022    2:55 PM  PHQ 2/9 Scores  PHQ - 2 Score 0 0  PHQ- 9 Score 0    Results for orders placed or performed in visit on 07/20/23  Comprehensive metabolic panel  Result Value Ref Range   Glucose 101 (H) 70 - 99 mg/dL   BUN 16 8 - 27 mg/dL   Creatinine, Ser 3.66 0.76 - 1.27 mg/dL   eGFR 87 >44 IH/KVQ/2.59   BUN/Creatinine Ratio 16 10 - 24   Sodium 142 134 - 144 mmol/L   Potassium 5.2 3.5 - 5.2 mmol/L   Chloride 105 96 - 106 mmol/L   CO2 19 (L) 20 - 29 mmol/L   Calcium 9.3 8.6 - 10.2 mg/dL   Total Protein 6.6 6.0 - 8.5 g/dL   Albumin 4.4 3.9 - 4.9 g/dL   Globulin, Total 2.2 1.5 - 4.5 g/dL   Bilirubin Total 0.3 0.0 - 1.2 mg/dL   Alkaline Phosphatase 38 (L) 44 - 121 IU/L   AST 21 0 - 40 IU/L   ALT 15 0 - 44 IU/L  Lipid panel  Result Value Ref Range   Cholesterol, Total 202  (H) 100 - 199 mg/dL   Triglycerides 46 0 - 149 mg/dL   HDL 95 >56 mg/dL   VLDL Cholesterol Cal 9 5 - 40 mg/dL   LDL Chol Calc (NIH) 98 0 - 99 mg/dL   Chol/HDL Ratio 2.1 0.0 - 5.0 ratio  Levetiracetam level  Result Value Ref Range   Levetiracetam Lvl WILL FOLLOW   CBC  Result Value Ref Range   WBC 4.2 3.4 - 10.8 x10E3/uL   RBC 3.96 (L) 4.14 - 5.80 x10E6/uL   Hemoglobin 13.0 13.0 - 17.7 g/dL   Hematocrit 38.7 56.4 - 51.0 %   MCV 101 (H) 79 - 97 fL   MCH 32.8 26.6 - 33.0 pg   MCHC 32.7 31.5 - 35.7 g/dL   RDW 33.2 95.1 - 88.4 %   Platelets 207 150 - 450 x10E3/uL  PSA Total (Reflex To Free)  Result Value Ref Range   Prostate Specific Ag, Serum 0.5 0.0 - 4.0 ng/mL   Reflex Criteria Comment   HCV Ab w Reflex to Quant PCR  Result Value Ref Range   HCV Ab Non Reactive Non Reactive  Interpretation:  Result Value Ref Range   HCV Interp 1: Comment     Assessment & Plan     Establishing care with new doctor, encounter for  Seizure disorder [facility]) Assessment & Plan: Seizure-free for the last eight years. Previously took Keppra 500 mg five tablets daily. No recent neurology follow-up. Prefers to continue Keppra due to past seizure recurrence when attempting to wean off.  - Order Keppra level -Continue Keppra 2500 mg daily  Orders: -  levETIRAcetam; TAKE 5 TABLETS BY MOUTH EVERY MORNING  Dispense: 450 tablet; Refill: 3 -     Levetiracetam level  History of recurrent deep vein thrombosis (DVT) Assessment & Plan: Currently on Eliquis 5 mg twice daily but self-reduced to half dose due to easy bruising. Reports increased leg tenderness and protruding varicose veins, indicating possible need to return to full dose. Discussed risks of reduced anticoagulation, including increased risk of thromboembolic events, and benefits of full dose to prevent these events. Prefers to resume full dose despite bruising concerns. - Resume full dose Eliquis 5 mg twice daily   History of pulmonary  embolism Assessment & Plan: Addressed as noted above.   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   - Will continue patient on Eliquis 5 mg twice daily. - Patient to follow-up with [facility] as he is overdue.  Orders: -     Apixaban; Take 1 tablet (5 mg total) by mouth 2 (two) times daily.  Dispense: 60 tablet; Refill: 5  Essential hypertension Assessment & Plan: Stable. Continue to monitor.  Orders: -     Comprehensive metabolic panel -     Lipid panel  Benign prostatic hyperplasia with nocturia Assessment & Plan: Patient has been experiencing nocturia, as well as dribbling and some difficulty urinating.  Will start him on tamsulosin as noted below.  Orders: -     Tamsulosin HCl; Take 1 capsule (0.4 mg total) by mouth daily.  Dispense: 30 capsule; Refill: 3  Transaminitis Assessment & Plan: Remote history of transaminitis in 2014. Will continue to monitor with routine CMPs   Thrombocytopenia, unspecified (HCC) Assessment & Plan: History of thrombocytopenia.  Will recheck CBC today.  Orders: -     CBC  High risk medication use -     Levetiracetam level  Prostate cancer screening -     PSA Total (Reflex To Free)  Screen for colon cancer -     Ambulatory referral to Gastroenterology  Encounter for hepatitis C screening test for low risk patient -     HCV Ab w Reflex to Quant PCR -     Interpretation:   Return in about 4 weeks (around 08/17/2023) for intro mAWV.     I discussed the assessment and treatment plan with the patient  The patient was provided an opportunity to ask questions and all were answered. The patient agreed with the plan and demonstrated an understanding of the instructions.   The patient was advised to call back or  seek an in-person evaluation if the symptoms worsen or if the condition fails to improve as anticipated.    Sherlyn Hay, DO  [facility] [phone] (phone) [phone] (fax)  [facility]

## 2023-07-23 ENCOUNTER — Other Ambulatory Visit: Payer: Self-pay

## 2023-07-23 ENCOUNTER — Telehealth: Payer: Self-pay

## 2023-07-23 DIAGNOSIS — Z1211 Encounter for screening for malignant neoplasm of colon: Secondary | ICD-10-CM

## 2023-07-23 MED ORDER — NA SULFATE-K SULFATE-MG SULF 17.5-3.13-1.6 GM/177ML PO SOLN: 1.0000 | Freq: Once | ORAL | 0 refills | Status: AC

## 2023-07-23 NOTE — Telephone Encounter (Signed)
Gastroenterology Pre-Procedure Review  Request Date: 08/24/23 Requesting Physician: Dr. Tobi Bastos  PATIENT REVIEW QUESTIONS: The patient responded to the following health history questions as indicated:    1. Are you having any GI issues? no 2. Do you have a personal history of Polyps? no 3. Do you have a family history of Colon Cancer or Polyps? no 4. Diabetes Mellitus? no 5. Joint replacements in the past 12 months?no 6. Major health problems in the past 3 months?no 7. Any artificial heart valves, MVP, or defibrillator?no    MEDICATIONS & ALLERGIES:    Patient reports the following regarding taking any anticoagulation/antiplatelet therapy:   Plavix, Coumadin, Eliquis, Xarelto, Lovenox, Pradaxa, Brilinta, or Effient? yes (eliquis prescribed by PCP ) Aspirin? no  Patient confirms/reports the following medications:  Current Outpatient Medications  Medication Sig Dispense Refill   apixaban (ELIQUIS) 5 MG TABS tablet Take 1 tablet (5 mg total) by mouth 2 (two) times daily. 60 tablet 5   levETIRAcetam (KEPPRA) 500 MG tablet TAKE 5 TABLETS BY MOUTH EVERY MORNING 450 tablet 3   tamsulosin (FLOMAX) 0.4 MG CAPS capsule Take 1 capsule (0.4 mg total) by mouth daily. 30 capsule 3   No current facility-administered medications for this visit.    Patient confirms/reports the following allergies:  Allergies  Allergen Reactions   Penicillins     unknown    No orders of the defined types were placed in this encounter.   AUTHORIZATION INFORMATION Primary Insurance: 1D#: Group #:  Secondary Insurance: 1D#: Group #:  SCHEDULE INFORMATION: Date: 08/24/23 Time: Location: [facility]

## 2023-07-24 DIAGNOSIS — Z86718 Personal history of other venous thrombosis and embolism: Secondary | ICD-10-CM | POA: Insufficient documentation

## 2023-07-24 DIAGNOSIS — N401 Enlarged prostate with lower urinary tract symptoms: Secondary | ICD-10-CM | POA: Insufficient documentation

## 2023-07-24 LAB — COMPREHENSIVE METABOLIC PANEL
ALT: 15 [IU]/L (ref 0–44)
AST: 21 [IU]/L (ref 0–40)
Albumin: 4.4 g/dL (ref 3.9–4.9)
Alkaline Phosphatase: 38 [IU]/L — ABNORMAL LOW (ref 44–121)
BUN/Creatinine Ratio: 16 (ref 10–24)
BUN: 16 mg/dL (ref 8–27)
Bilirubin Total: 0.3 mg/dL (ref 0.0–1.2)
CO2: 19 mmol/L — ABNORMAL LOW (ref 20–29)
Calcium: 9.3 mg/dL (ref 8.6–10.2)
Chloride: 105 mmol/L (ref 96–106)
Creatinine, Ser: 0.97 mg/dL (ref 0.76–1.27)
Globulin, Total: 2.2 g/dL (ref 1.5–4.5)
Glucose: 101 mg/dL — ABNORMAL HIGH (ref 70–99)
Potassium: 5.2 mmol/L (ref 3.5–5.2)
Sodium: 142 mmol/L (ref 134–144)
Total Protein: 6.6 g/dL (ref 6.0–8.5)
eGFR: 87 mL/min/{1.73_m2} (ref >59)

## 2023-07-24 LAB — LEVETIRACETAM LEVEL: Levetiracetam Lvl: 49.9 ug/mL — ABNORMAL HIGH (ref 10.0–40.0)

## 2023-07-24 LAB — CBC
Hematocrit: 39.8 % (ref 37.5–51.0)
Hemoglobin: 13 g/dL (ref 13.0–17.7)
MCH: 32.8 pg (ref 26.6–33.0)
MCHC: 32.7 g/dL (ref 31.5–35.7)
MCV: 101 fL — ABNORMAL HIGH (ref 79–97)
Platelets: 207 10*3/uL (ref 150–450)
RBC: 3.96 x10E6/uL — ABNORMAL LOW (ref 4.14–5.80)
RDW: 12.2 % (ref 11.6–15.4)
WBC: 4.2 10*3/uL (ref 3.4–10.8)

## 2023-07-24 LAB — LIPID PANEL
Chol/HDL Ratio: 2.1 {ratio} (ref 0.0–5.0)
Cholesterol, Total: 202 mg/dL — ABNORMAL HIGH (ref 100–199)
HDL: 95 mg/dL (ref >39)
LDL Chol Calc (NIH): 98 mg/dL (ref 0–99)
Triglycerides: 46 mg/dL (ref 0–149)
VLDL Cholesterol Cal: 9 mg/dL (ref 5–40)

## 2023-07-24 LAB — HCV AB W REFLEX TO QUANT PCR: HCV Ab: NONREACTIVE

## 2023-07-24 LAB — PSA TOTAL (REFLEX TO FREE): Prostate Specific Ag, Serum: 0.5 ng/mL (ref 0.0–4.0)

## 2023-07-24 LAB — HCV INTERPRETATION

## 2023-07-24 NOTE — Assessment & Plan Note (Signed)
 Seizure-free for the last eight years. Previously took Keppra  500 mg five tablets daily. No recent neurology follow-up. Prefers to continue Keppra  due to past seizure recurrence when attempting to wean off.  - Order Keppra  level -Continue Keppra  2500 mg daily

## 2023-07-24 NOTE — Assessment & Plan Note (Signed)
Stable.       - Continue to monitor

## 2023-07-24 NOTE — Assessment & Plan Note (Signed)
 Patient had previously been restarted on Eliquis  due to increased incidence of superficial thrombophlebitis, as well as his recurrent history of DVT and pulmonary embolism.   - Will continue patient on Eliquis  5 mg twice daily. - Patient to follow-up with [facility] as he is overdue.

## 2023-07-24 NOTE — Assessment & Plan Note (Signed)
 Currently on Eliquis  5 mg twice daily but self-reduced to half dose due to easy bruising. Reports increased leg tenderness and protruding varicose veins, indicating possible need to return to full dose. Discussed risks of reduced anticoagulation, including increased risk of thromboembolic events, and benefits of full dose to prevent these events. Prefers to resume full dose despite bruising concerns. - Resume full dose Eliquis  5 mg twice daily

## 2023-07-24 NOTE — Assessment & Plan Note (Signed)
History of thrombocytopenia.  Will recheck CBC today.

## 2023-07-24 NOTE — Assessment & Plan Note (Signed)
Remote history of transaminitis in 2014. Will continue to monitor with routine CMPs

## 2023-07-24 NOTE — Assessment & Plan Note (Signed)
Patient has been experiencing nocturia, as well as dribbling and some difficulty urinating.  Will start him on tamsulosin as noted below.

## 2023-07-24 NOTE — Assessment & Plan Note (Signed)
 Addressed as noted above.

## 2023-07-27 ENCOUNTER — Telehealth: Payer: Self-pay | Admitting: Family Medicine

## 2023-07-27 NOTE — Telephone Encounter (Signed)
 Received blood thinner information request from [facility] and placed in Dr. Rexanne Mano box.

## 2023-07-31 NOTE — Telephone Encounter (Signed)
Completed and placed in fax box 

## 2023-08-17 ENCOUNTER — Telehealth: Payer: Self-pay

## 2023-08-17 NOTE — Telephone Encounter (Signed)
Patient said he had to go out of town at the last minute and had to cancel his colonoscopy.  I informed him I realized it after I called that the procedure had been canceled.  Informed him that he can call back to reschedule when his schedule allows.  Thanks,  [place], [place]

## 2023-08-17 NOTE — Telephone Encounter (Signed)
LVM advising patient per PCP blood thinner advice received on 07/31/23 the following," Do not take eliquis the day before the procedure or the day of the procedure.  Restart the day after the procedure at least 24 hours after the procedure".  Asked patient to call office if he has any questions regarding stopping Eliquis prior to colonoscopy scheduled 08/24/23.  Thanks,  [place], [place]

## 2023-08-22 ENCOUNTER — Encounter: Payer: PPO | Admitting: Family Medicine

## 2023-08-22 ENCOUNTER — Encounter: Payer: Self-pay | Admitting: Family Medicine

## 2023-08-22 ENCOUNTER — Ambulatory Visit (INDEPENDENT_AMBULATORY_CARE_PROVIDER_SITE_OTHER): Payer: PPO | Admitting: Family Medicine

## 2023-08-22 VITALS — BP 136/81 | HR 66 | Resp 16 | Ht 72.0 in | Wt 180.1 lb

## 2023-08-22 DIAGNOSIS — Z0000 Encounter for general adult medical examination without abnormal findings: Secondary | ICD-10-CM | POA: Diagnosis not present

## 2023-08-22 DIAGNOSIS — Z23 Encounter for immunization: Secondary | ICD-10-CM

## 2023-08-22 NOTE — Assessment & Plan Note (Signed)
ECG shows sinus rhythm without ectopy or ST abnormalities, with a rate 64, PR-interval 190, QRS-interval 116 and QT/QTc-interval 410/422.

## 2023-08-22 NOTE — Progress Notes (Signed)
Subjective:    Scott Fleming is a 68 y.o. male who presents for a Welcome to Medicare exam.     He experiences soreness from heavy lifting, which he attributes to his physical activity at work, including lifting sixty-pound boxes. He runs and is on his feet a lot during the day, which helps him maintain his weight.  He reports a runny nose but no allergies. He has not tried Flonase or similar medications. He experiences dry skin and does not regularly apply lotion.  He has not had regular eye exams and notes difficulty reading without readers, which he purchases from the store. He does not experience headaches with them.  He maintains an active lifestyle, running a couple of times a week and using a treadmill and bike during colder months. He walks with his dog frequently. He aims to keep his weight stable despite increased administrative responsibilities at work.     Objective:    Today's Vitals   08/22/23 1506 08/22/23 1517  BP: (!) 136/92 136/81  Pulse: 66 66  Resp: 16   Weight: 180 lb 1.6 oz (81.7 kg)   Height: 6' (1.829 m)    Body mass index is 24.43 kg/m.  Review of Systems  HENT:  Negative for congestion.        +rhinorrhea  Respiratory:  Negative for cough, shortness of breath and wheezing.   Cardiovascular:  Negative for chest pain, palpitations and leg swelling.  Gastrointestinal:  Negative for abdominal pain, nausea and vomiting.  Neurological:  Negative for dizziness, seizures (no recent seizures), weakness and headaches.  Endo/Heme/Allergies:  Bruises/bleeds easily (iso anticoagulation).       +rash (left leg), no longer itching, improving per pt    Medications Outpatient Encounter Medications as of 08/22/2023  Medication Sig   apixaban (ELIQUIS) 5 MG TABS tablet Take 1 tablet (5 mg total) by mouth 2 (two) times daily.   levETIRAcetam (KEPPRA) 500 MG tablet TAKE 5 TABLETS BY MOUTH EVERY MORNING   tamsulosin (FLOMAX) 0.4 MG CAPS capsule Take 1 capsule (0.4 mg  total) by mouth daily. (Patient not taking: Reported on 08/22/2023)   No facility-administered encounter medications on file as of 08/22/2023.     History: Past Medical History:  Diagnosis Date   Pulmonary embolism (HCC)    [place] spotted fever    Seizures (HCC)    Past Surgical History:  Procedure Laterality Date   skin cancer removed      Family History  Problem Relation Age of Onset   Lupus Mother    Social History   Occupational History   Not on file  Tobacco Use   Smoking status: Never   Smokeless tobacco: Never  Vaping Use   Vaping status: Never Used  Substance and Sexual Activity   Alcohol use: Yes    Alcohol/week: 3.0 standard drinks of alcohol    Types: 3 Cans of beer per week    Comment: occ   Drug use: No   Sexual activity: Yes    Tobacco Counseling Not indicated (non/never-smoker)   Immunizations and Health Maintenance Immunization History  Administered Date(s) Administered   PNEUMOCOCCAL CONJUGATE-20 08/22/2023   Health Maintenance Due  Topic Date Due   DTaP/Tdap/Td (1 - Tdap) Never done   Zoster Vaccines- Shingrix (1 of 2) Never done    Activities of Daily Living    08/22/2023    3:08 PM  In your present state of health, do you have any difficulty performing the following activities:  Hearing? 0  Vision? 0  Difficulty concentrating or making decisions? 0  Walking or climbing stairs? 0  Dressing or bathing? 0  Doing errands, shopping? 0    Physical Exam   Physical Exam Vitals and nursing note reviewed.  Constitutional:      General: He is awake.     Appearance: Normal appearance.  HENT:     Head: Normocephalic and atraumatic.     Right Ear: Tympanic membrane, ear canal and external ear normal.     Left Ear: Tympanic membrane, ear canal and external ear normal.     Nose: Nose normal.     Mouth/Throat:     Mouth: Mucous membranes are moist.     Pharynx: Oropharynx is clear. No oropharyngeal exudate or posterior  oropharyngeal erythema.  Eyes:     General: No scleral icterus.    Extraocular Movements: Extraocular movements intact.     Conjunctiva/sclera: Conjunctivae normal.     Pupils: Pupils are equal, round, and reactive to light.  Neck:     Thyroid: No thyromegaly or thyroid tenderness.  Cardiovascular:     Rate and Rhythm: Normal rate and regular rhythm.     Pulses: Normal pulses.     Heart sounds: Normal heart sounds.  Pulmonary:     Effort: Pulmonary effort is normal. No tachypnea, bradypnea or respiratory distress.     Breath sounds: Normal breath sounds. No stridor. No wheezing, rhonchi or rales.  Abdominal:     General: Bowel sounds are normal. There is no distension.     Palpations: Abdomen is soft. There is no mass.     Tenderness: There is no abdominal tenderness. There is no guarding.     Hernia: No hernia is present.  Musculoskeletal:     Cervical back: Normal range of motion and neck supple.     Right lower leg: No edema.     Left lower leg: No edema.  Lymphadenopathy:     Cervical: No cervical adenopathy.  Skin:    General: Skin is warm and dry.     Findings: Bruising present.  Neurological:     Mental Status: He is alert and oriented to person, place, and time. Mental status is at baseline.  Psychiatric:        Mood and Affect: Mood normal.        Behavior: Behavior normal.    (optional), or other factors deemed appropriate based on the beneficiary's medical and social history and current clinical standards.   EKG:  normal EKG, normal sinus rhythm, unchanged from previous tracings     Assessment:    This is a routine wellness  examination for this patient.     Goals      Exercise 150 min/wk Moderate Activity         Depression Screen    08/22/2023    3:07 PM 07/20/2023    1:41 PM 03/30/2022    2:55 PM  PHQ 2/9 Scores  PHQ - 2 Score 0 0 0  PHQ- 9 Score 0 0      Fall Risk    08/22/2023    3:07 PM  Fall Risk   Falls in the past year? 0  Number  falls in past yr: 0  Injury with Fall? 0  Risk for fall due to : No Fall Risks    Cognitive Function        08/22/2023    3:08 PM  6CIT Screen  What Year? 0 points  What  month? 0 points  What time? 0 points  Count back from 20 0 points  Months in reverse 0 points  Repeat phrase 0 points  Total Score 0 points    Patient Care Team: Sherlyn Hay, DO as PCP - General (Family Medicine)     Plan:    Hypertension Blood pressure very mildly elevated without associated symptoms. Patient is active and maintains a healthy diet. - Encourage lifestyle modifications including diet and exercise  Hyperlipidemia Cholesterol levels are slightly elevated, with a 9.8% ten-year risk for cardiovascular events. Discussed calcium cardiac scoring CT for coronary artery disease. Patient prefers diet and exercise management. - Continue current diet and exercise regimen - Consider starting cholesterol medication if risk increases  Rash Rash with burning and itching, likely from scratching. No vesicles or significant pain suggestive of shingles. Discussed shingles vaccine to prevent complications. - Apply hydrocortisone cream to affected area - Use daily lotion to manage dry skin - Administer shingles vaccine  Alcohol Use Consumes alcohol a couple of times a week, with occasional binge drinking. No significant negative impacts reported. Discussed benefits of reducing alcohol consumption. - Advise limiting alcohol intake to two drinks at a time - Discuss benefits of reducing alcohol consumption  General Health Maintenance Generally healthy, active, and maintains a balanced diet. Previous screenings and blood work are normal. Needs Tdap vaccine and colonoscopy rescheduled. - Administer Tdap vaccine - Reschedule colonoscopy for mid-February - Encourage regular eye exams - Consider using Flonase for persistent runny nose  Follow-up - Schedule follow-up appointment in six months - Advise  contacting the office if new symptoms or concerns arise.    I have personally reviewed and noted the following in the patient's chart:   Medical and social history Use of alcohol, tobacco or illicit drugs  Current medications and supplements Functional ability and status Nutritional status Physical activity Advanced directives List of other physicians Hospitalizations, surgeries, and ER visits in previous 12 months Vitals Screenings to include cognitive, depression, and falls Referrals and appointments  In addition, I have reviewed and discussed with patient certain preventive protocols, quality metrics, and best practice recommendations. A written personalized care plan for preventive services as well as general preventive health recommendations were provided to patient.     Dion Parrow N Emmilee Reamer, DO 08/22/2023

## 2023-08-22 NOTE — Progress Notes (Signed)
Marland Kitchen

## 2023-08-22 NOTE — Patient Instructions (Addendum)
Recommended vaccines:  - Tdap  - Shingrix (shingles)

## 2023-08-24 ENCOUNTER — Encounter: Admission: RE | Payer: Self-pay | Source: Home / Self Care

## 2023-08-24 ENCOUNTER — Encounter: Payer: Self-pay | Admitting: Family Medicine

## 2023-08-24 ENCOUNTER — Ambulatory Visit: Admission: RE | Admit: 2023-08-24 | Payer: PPO | Source: Home / Self Care | Admitting: Gastroenterology

## 2023-08-24 SURGERY — COLONOSCOPY WITH PROPOFOL
Anesthesia: General

## 2023-11-18 ENCOUNTER — Other Ambulatory Visit: Payer: Self-pay | Admitting: Family Medicine

## 2023-11-18 DIAGNOSIS — N401 Enlarged prostate with lower urinary tract symptoms: Secondary | ICD-10-CM

## 2023-11-20 NOTE — Telephone Encounter (Signed)
 Last OV 08/22/23 Requested Prescriptions  Pending Prescriptions Disp Refills   tamsulosin  (FLOMAX ) 0.4 MG CAPS capsule [Pharmacy Med Name: TAMSULOSIN  0.4MG  CAPSULES] 30 capsule 3    Sig: TAKE 1 CAPSULE(0.4 MG) BY MOUTH DAILY     Urology: Alpha-Adrenergic Blocker Failed - 11/20/2023  9:31 AM      Failed - Valid encounter within last 12 months    Recent Outpatient Visits   None     Future Appointments             In 3 months Pardue, Asencion Blacksmith, DO [place] [facility]            Passed - PSA in normal range and within 360 days    Prostate Specific Ag, Serum  Date Value Ref Range Status  07/23/2023 0.5 0.0 - 4.0 ng/mL Final    Comment:    Roche ECLIA methodology. According to the American Urological Association, Serum PSA should decrease and remain at undetectable levels after radical prostatectomy. The AUA defines biochemical recurrence as an initial PSA value 0.2 ng/mL or greater followed by a subsequent confirmatory PSA value 0.2 ng/mL or greater. Values obtained with different assay methods or kits cannot be used interchangeably. Results cannot be interpreted as absolute evidence of the presence or absence of malignant disease.          Passed - Last BP in normal range    BP Readings from Last 1 Encounters:  08/22/23 136/81

## 2024-01-11 ENCOUNTER — Ambulatory Visit: Payer: Self-pay

## 2024-01-11 NOTE — Telephone Encounter (Signed)
 FYI Only or Action Required?: FYI only for provider.  Patient was last seen in primary care on 08/22/2023 by Carlean Charter, DO. Called Nurse Triage reporting Foot Swelling. Symptoms began several weeks ago. Interventions attempted: Nothing. Symptoms are: gradually worsening.  Triage Disposition: See Physician Within 24 Hours  Patient/caregiver understands and will follow disposition?: Yes  Patient agrees to be seen in UC as there are no available appointments until July. He will follow up with PCP at a later time.               Copied from CRM [id number]. Topic: Clinical - Red Word Triage >> Jan 11, 2024 10:25 AM El Gravely T wrote: Red Word that prompted transfer to Nurse Triage: Left foot swelling, unable to see ankles. Per patient states there is a boil/cyst on lower leg, warm to the touch. Reason for Disposition  [1] MODERATE leg swelling (e.g., swelling extends up to knees) AND [2] new-onset or worsening  Answer Assessment - Initial Assessment Questions 1. ONSET: When did the swelling start? (e.g., minutes, hours, days)     Left foot swelling, x 1 week  2. LOCATION: What part of the leg is swollen?  Are both legs swollen or just one leg?     Left foot up to the left knee, cannot see ankle 3. SEVERITY: How bad is the swelling? (e.g., localized; mild, moderate, severe)   - Localized: Small area of swelling localized to one leg.   - MILD pedal edema: Swelling limited to foot and ankle, pitting edema < 1/4 inch (6 mm) deep, rest and elevation eliminate most or all swelling.   - MODERATE edema: Swelling of lower leg to knee, pitting edema > 1/4 inch (6 mm) deep, rest and elevation only partially reduce swelling.   - SEVERE edema: Swelling extends above knee, facial or hand swelling present.      Moderate 4. REDNESS: Does the swelling look red or infected?     Redness/ Purple  5. PAIN: Is the swelling painful to touch? If Yes, ask: How painful is it?   (Scale 1-10;  mild, moderate or severe)     No 6. FEVER: Do you have a fever? If Yes, ask: What is it, how was it measured, and when did it start?      No  7. CAUSE: What do you think is causing the leg swelling?     Unknown, he states this has happened before  8. MEDICAL HISTORY: Do you have a history of blood clots (e.g., DVT), cancer, heart failure, kidney disease, or liver failure?     Blood clots, on Eloquist 9. RECURRENT SYMPTOM: Have you had leg swelling before? If Yes, ask: When was the last time? What happened that time? Intermittent for weeks  10. OTHER SYMPTOMS: Do you have any other symptoms? (e.g., chest pain, difficulty breathing)       No   He states he has a boil on the inside of  Left lower calf.  Protocols used: Leg Swelling and Edema-A-AH

## 2024-01-21 ENCOUNTER — Other Ambulatory Visit: Payer: Self-pay | Admitting: Family Medicine

## 2024-01-21 DIAGNOSIS — I8002 Phlebitis and thrombophlebitis of superficial vessels of left lower extremity: Secondary | ICD-10-CM

## 2024-01-30 ENCOUNTER — Telehealth: Payer: Self-pay | Admitting: Family Medicine

## 2024-01-30 DIAGNOSIS — I8002 Phlebitis and thrombophlebitis of superficial vessels of left lower extremity: Secondary | ICD-10-CM

## 2024-01-30 NOTE — Telephone Encounter (Signed)
 Copied from CRM [id number]. Topic: Clinical - Medication Refill >> Jan 30, 2024 11:27 AM Nathanel BROCKS wrote: Medication: ELIQUIS  5 MG TABS tablet  Has the patient contacted their pharmacy? Yes  This is the patient's preferred pharmacy:  [id number] [place], [place] - [address] AT [facility] [address] [place] [address] Phone: [phone] Fax: [phone]  Is this the correct pharmacy for this prescription? Yes If no, delete pharmacy and type the correct one.   Has the prescription been filled recently? Yes  Is the patient out of the medication? Yes  Has the patient been seen for an appointment in the last year OR does the patient have an upcoming appointment? Yes  Can we respond through MyChart? No  Agent: Please be advised that Rx refills may take up to 3 business days. We ask that you follow-up with your pharmacy.

## 2024-02-20 ENCOUNTER — Ambulatory Visit: Payer: Self-pay | Admitting: Family Medicine

## 2024-02-20 VITALS — BP 137/87 | HR 63 | Ht 72.0 in | Wt 186.6 lb

## 2024-02-20 DIAGNOSIS — I8002 Phlebitis and thrombophlebitis of superficial vessels of left lower extremity: Secondary | ICD-10-CM

## 2024-02-20 DIAGNOSIS — G40909 Epilepsy, unspecified, not intractable, without status epilepticus: Secondary | ICD-10-CM | POA: Diagnosis not present

## 2024-02-20 DIAGNOSIS — Z86718 Personal history of other venous thrombosis and embolism: Secondary | ICD-10-CM

## 2024-02-20 DIAGNOSIS — Z1211 Encounter for screening for malignant neoplasm of colon: Secondary | ICD-10-CM

## 2024-02-20 NOTE — Patient Instructions (Addendum)
 Complete your Cologuard please!   Continue taking Keppra  4 tablets daily.   Recommended vaccines:   - Tdap (tetanus, diphtheria, and pertussis (whooping cough))  - Shingrix (shingles)     Check your blood pressure twice weekly, and any time you have concerning symptoms like headache, chest pain, dizziness, shortness of breath, or vision changes.   Our goal is less than 130/80.  To appropriately check your blood pressure, make sure you do the following:  1) Avoid caffeine, exercise, or tobacco products for 30 minutes before checking. Empty your bladder. 2) Sit with your back supported in a flat-backed chair. Rest your arm on something flat (arm of the chair, table, etc). 3) Sit still with your feet flat on the floor, resting, for at least 5 minutes.  4) Check your blood pressure. Take 1-2 readings.  5) Write down these readings and bring with you to any provider appointments.  Bring your home blood pressure machine with you to a provider's office for accuracy comparison at least once a year.   Make sure you take your blood pressure medications before you come to any office visit, even if you were asked to fast for labs.

## 2024-02-20 NOTE — Progress Notes (Signed)
 Established patient visit   Patient: Scott Fleming   DOB: Jul 26, 1955   68 y.o. Male  MRN: [id number] Visit Date: 02/20/2024  Today's healthcare provider: LAURAINE LOISE BUOY, DO   Chief Complaint  Patient presents with   Medical Management of Chronic Issues    Patient was last seen 08/22/23. Labs last completed 07/23/23. He would like flomax  removed from his chart and taking all other medications as prescribed. Declined colonscopy 08/22/23. He reports no symptoms or concerns   Subjective    HPI Scott Fleming is a 68 year old male with Crohn's disease who presents for follow-up and medication management.  He is currently managing his Crohn's disease symptoms without specific concerns. He previously trialed Flomax  for benign prostatic hyperplasia but discontinued it due to adverse effects.  He has not undergone a colonoscopy due to work-related travel commitments and has a Cologuard kit at home that remains unused. He is contemplating retirement due to the demands of his job, which involves extensive travel.  He has a cyst on his right medial leg that he picked at, resulting in pus, and applied cortisone to alleviate itching. He also experiences leg swelling, which he attributes to varicose veins and venous insufficiency. He uses compression socks but has not refilled his Eliquis  prescription for three weeks, which he takes for recurrent deep vein thrombosis and superficial thrombophlebitis.  He also takes Keppra  for seizures and has reduced his dose from five to four tablets due to high levels. He has not had seizures recently but remains cautious about discontinuing the medication.  He is concerned about weight gain, attributing it to increased driving and poor eating habits. He is attempting to manage his weight by returning to his usual routine of limited eating during the day and increasing physical activity, including using a treadmill and cutting grass.  He has a history of blood  clots in his leg and has undergone venous ablation in the past. He is aware of the symptoms of a clot and is prepared to react if necessary. He is not currently interested in seeing vascular surgery again.  He has not had the shingles or tetanus vaccines recently and plans to get them done. He is also considering his job situation and the possibility of retirement or part-time work.      Medications: Outpatient Medications Prior to Visit  Medication Sig   ELIQUIS  5 MG TABS tablet TAKE 1 TABLET(5 MG) BY MOUTH TWICE DAILY   levETIRAcetam  (KEPPRA ) 500 MG tablet TAKE 5 TABLETS BY MOUTH EVERY MORNING   [DISCONTINUED] tamsulosin  (FLOMAX ) 0.4 MG CAPS capsule TAKE 1 CAPSULE(0.4 MG) BY MOUTH DAILY   No facility-administered medications prior to visit.        Objective    BP 137/87 (BP Location: Right Arm, Patient Position: Sitting, Cuff Size: Normal)   Pulse 63   Ht 6' (1.829 m)   Wt 186 lb 9.6 oz (84.6 kg)   SpO2 99%   BMI 25.31 kg/m     Physical Exam Vitals and nursing note reviewed.  Constitutional:      General: He is not in acute distress.    Appearance: Normal appearance.  HENT:     Head: Normocephalic and atraumatic.  Eyes:     General: No scleral icterus.    Conjunctiva/sclera: Conjunctivae normal.  Cardiovascular:     Rate and Rhythm: Normal rate.  Pulmonary:     Effort: Pulmonary effort is normal.  Musculoskeletal:     Right lower  leg: Edema (trace) present.     Left lower leg: Edema (+1) present.  Skin:        Comments: Scab with excoriation (also scabbed) below to left leg. Approx. 1 cm diameter cyst present to right leg.  Neurological:     Mental Status: He is alert and oriented to person, place, and time. Mental status is at baseline.  Psychiatric:        Mood and Affect: Mood normal.        Behavior: Behavior normal.      No results found for any visits on 02/20/24.  Assessment & Plan    Phlebitis and thombophlb of superfic vessels of l low  extrem  History of recurrent deep vein thrombosis (DVT)  Seizure disorder (HCC)  Encounter for colorectal cancer screening -     Cologuard      Crohn's Disease Managing symptoms without specific concerns.  Venous Insufficiency with Varicose Veins Leg swelling due to venous insufficiency and varicose veins, exacerbated by prolonged standing or driving. On Eliquis  for DVT history, not refilled for three weeks. Aware of clot symptoms, prefers not to consult vascular surgery currently. - Patient to pick up Eliquis  refill and consider initiating automatic refills. - Continue compression socks as needed.  Hypertension Chronic, stable.  Blood pressure at 137/87 mmHg, slightly elevated.  Goal BP <130/80 mmHg. Open to starting low-dose medication if necessary, but will monitor at home first. - Monitor blood pressure at home. - Consider low-dose antihypertensive if readings remain high.  Benign Prostatic Hyperplasia (BPH) Trialed Flomax  but experienced undesirable side effects. Currently tolerating symptoms without medication. - Consider alternative medications if symptoms worsen.  Seizure Disorder On Keppra  for seizure management. Keppra  level was high, dose reduced from five to four tablets. No recent seizures. Discussed concerns about long-term effects on liver and kidneys. - Continue Keppra  at four tablets daily. - Recheck Keppra  levels on routine basis.  Weight Gain Attributed to increased driving and poor dietary habits. Attempting to manage with healthier eating and increased physical activity. - Encourage physical activity and healthy eating.  General Health Maintenance Has not had a colonoscopy, has a possibly expired Cologuard kit. Lacks shingles and tetanus vaccines. Discussed importance of screenings and vaccinations. Plans to complete Cologuard test and consider vaccinations. - Send new Cologuard kit if current one is expired. - Administer shingles and tetanus vaccines  when convenient.    Return to clinic return 6 to 12 weeks for follow-up and recheck of blood pressure.     I discussed the assessment and treatment plan with the patient  The patient was provided an opportunity to ask questions and all were answered. The patient agreed with the plan and demonstrated an understanding of the instructions.   The patient was advised to call back or seek an in-person evaluation if the symptoms worsen or if the condition fails to improve as anticipated.    LAURAINE LOISE BUOY, DO  [facility] [phone] (phone) [phone] (fax)  [facility]

## 2024-07-25 ENCOUNTER — Other Ambulatory Visit: Payer: Self-pay | Admitting: Family Medicine

## 2024-07-25 DIAGNOSIS — G40909 Epilepsy, unspecified, not intractable, without status epilepticus: Secondary | ICD-10-CM
